# Patient Record
Sex: Female | Born: 1960 | Race: Black or African American | Hispanic: No | Marital: Married | Smoking: Never smoker
Health system: Southern US, Community
[De-identification: ages and names within clinical notes are randomized; demographics above are authoritative.]

## PROBLEM LIST (undated history)

## (undated) DIAGNOSIS — I10 Essential (primary) hypertension: Secondary | ICD-10-CM

## (undated) DIAGNOSIS — E785 Hyperlipidemia, unspecified: Secondary | ICD-10-CM

## (undated) DIAGNOSIS — R9431 Abnormal electrocardiogram [ECG] [EKG]: Secondary | ICD-10-CM

## (undated) HISTORY — DX: Essential (primary) hypertension: I10

## (undated) HISTORY — PX: ABDOMINAL HYSTERECTOMY: SHX81

## (undated) HISTORY — DX: Hyperlipidemia, unspecified: E78.5

## (undated) HISTORY — DX: Abnormal electrocardiogram (ECG) (EKG): R94.31

---

## 2013-09-02 ENCOUNTER — Other Ambulatory Visit: Payer: Self-pay | Admitting: Family Medicine

## 2013-09-02 DIAGNOSIS — Z1231 Encounter for screening mammogram for malignant neoplasm of breast: Secondary | ICD-10-CM

## 2013-09-24 ENCOUNTER — Ambulatory Visit
Admission: RE | Admit: 2013-09-24 | Discharge: 2013-09-24 | Disposition: A | Discharge status: Home / Self Care | Payer: BC Managed Care – PPO | Source: Ambulatory Visit | Attending: Family Medicine | Admitting: Family Medicine

## 2013-09-24 DIAGNOSIS — Z1231 Encounter for screening mammogram for malignant neoplasm of breast: Secondary | ICD-10-CM

## 2013-10-03 ENCOUNTER — Ambulatory Visit
Admission: RE | Admit: 2013-10-03 | Discharge: 2013-10-03 | Disposition: A | Discharge status: Home / Self Care | Payer: BC Managed Care – PPO | Source: Ambulatory Visit | Attending: Family Medicine | Admitting: Family Medicine

## 2013-10-03 ENCOUNTER — Other Ambulatory Visit: Payer: Self-pay | Admitting: Family Medicine

## 2013-10-03 DIAGNOSIS — R911 Solitary pulmonary nodule: Secondary | ICD-10-CM

## 2013-12-04 ENCOUNTER — Ambulatory Visit (INDEPENDENT_AMBULATORY_CARE_PROVIDER_SITE_OTHER): Payer: Self-pay | Admitting: Family Medicine

## 2013-12-04 VITALS — BP 130/80 | HR 60 | Temp 98.9°F | Resp 16 | Ht 65.0 in | Wt 162.6 lb

## 2013-12-04 DIAGNOSIS — H53131 Sudden visual loss, right eye: Secondary | ICD-10-CM

## 2013-12-04 DIAGNOSIS — H53139 Sudden visual loss, unspecified eye: Secondary | ICD-10-CM

## 2013-12-04 NOTE — Progress Notes (Signed)
Subjective: 53 year old lady from the [place] who is a Sport and exercise psychologist in education at [employer]. she awakened early yesterday morning and had a significant visual loss of her right eye. She proceeded as being a 90% vision deficit in the right eye. She can see fine from left. She was able to get himself up and go to the bathroom and back to bed. After several minutes the loss of vision subsided. She did go to see her eye doctor yesterday, who did a dilated pupil exam and did not find anything specific. He said a message to her doctors at [facility]. Apparently he said it could have been a little blood vessel that popped. Her vision has remained satisfactory. She has a relatively new set of glasses. She did talk with her physicians at the [facility] clinic who wanted her to come get checked. She did not have any other neurologic symptoms with speech or coronation.  She has a history of hypertension hyperlipidemia, on medications. No other major health issues.  Objective: Pleasant alert lady in no acute distress. Her eyes PERRLA. EOMs intact. Fundi benign with discs appear normal. No retinal abnormalities were noted. Adequate visualization was obtained. No carotid bruits. Chest clear. Heart regular without murmurs. Vital signs are stable.  Assessment: Transient right eye vision loss, resolved  Plan: In the absence of any other symptoms I do not believe this was any major event. No indication was found for doing additional workup at this time. If she has any further problems she is to return or go to the emergency room.  A copy of the note will be sent with her to her physicians at [place].

## 2013-12-04 NOTE — Patient Instructions (Signed)
Return if any further symptoms or recurrences. If you are unable to come in here at the time or we are closed, go to the emergency room.

## 2014-08-26 ENCOUNTER — Other Ambulatory Visit: Payer: Self-pay | Admitting: Family Medicine

## 2014-08-26 DIAGNOSIS — R911 Solitary pulmonary nodule: Secondary | ICD-10-CM

## 2014-08-28 ENCOUNTER — Other Ambulatory Visit: Payer: Self-pay

## 2014-10-13 DIAGNOSIS — R3129 Other microscopic hematuria: Secondary | ICD-10-CM | POA: Insufficient documentation

## 2014-10-13 HISTORY — DX: Other microscopic hematuria: R31.29

## 2014-10-16 DIAGNOSIS — R82998 Other abnormal findings in urine: Secondary | ICD-10-CM

## 2014-10-16 HISTORY — DX: Other abnormal findings in urine: R82.998

## 2014-11-02 ENCOUNTER — Other Ambulatory Visit: Payer: Self-pay

## 2014-11-02 DIAGNOSIS — Z1231 Encounter for screening mammogram for malignant neoplasm of breast: Secondary | ICD-10-CM

## 2014-11-06 ENCOUNTER — Ambulatory Visit
Admission: RE | Admit: 2014-11-06 | Discharge: 2014-11-06 | Disposition: A | Discharge status: Home / Self Care | Payer: BLUE CROSS/BLUE SHIELD | Source: Ambulatory Visit

## 2014-11-06 DIAGNOSIS — Z1231 Encounter for screening mammogram for malignant neoplasm of breast: Secondary | ICD-10-CM

## 2015-01-30 ENCOUNTER — Ambulatory Visit (INDEPENDENT_AMBULATORY_CARE_PROVIDER_SITE_OTHER): Payer: Self-pay | Admitting: Family Medicine

## 2015-01-30 VITALS — BP 124/80 | HR 79 | Temp 98.2°F | Ht 65.0 in | Wt 161.6 lb

## 2015-01-30 DIAGNOSIS — T148 Other injury of unspecified body region: Secondary | ICD-10-CM

## 2015-01-30 DIAGNOSIS — T148XXA Other injury of unspecified body region, initial encounter: Secondary | ICD-10-CM

## 2015-01-30 DIAGNOSIS — M25559 Pain in unspecified hip: Secondary | ICD-10-CM

## 2015-01-30 NOTE — Progress Notes (Signed)
      Chief Complaint:  Chief Complaint  Patient presents with  . Pelvic Pain    x's 3 weeks s/p exercise    HPI: Carolyn Morgan is a 54 y.o. female who reports to [facility] today complaining of pelvic pain along her Cesarean section scar about 3 weeks ago after she was doing some new leg lift exercise. She has been getting better, about 50 % better without taking anything. She denies any n/w/t, hematuria, n/v. She denies any urinary sxs or vaginal dc. No incontinence  No past medical history on file. No past surgical history on file. Social History   Social History  . Marital Status: Married    Spouse Name: N/A  . Number of Children: N/A  . Years of Education: N/A   Social History Main Topics  . Smoking status: Never Smoker   . Smokeless tobacco: None  . Alcohol Use: No  . Drug Use: No  . Sexual Activity: Not Asked   Other Topics Concern  . None   Social History Narrative   No family history on file. No Known Allergies Prior to Admission medications   Medication Sig Start Date End Date Taking? Authorizing Provider  atorvastatin (LIPITOR) 10 MG tablet Take 10 mg by mouth daily.   Yes Historical Provider, MD  lisinopril-hydrochlorothiazide (PRINZIDE,ZESTORETIC) 20-12.5 MG per tablet Take 1 tablet by mouth daily.   Yes Historical Provider, MD     ROS: The patient denies fevers, chills, night sweats, unintentional weight loss, chest pain, palpitations, wheezing, dyspnea on exertion, nausea, vomiting, abdominal pain, dysuria, hematuria, melena, numbness, weakness, or tingling.   All other systems have been reviewed and were otherwise negative with the exception of those mentioned in the HPI and as above.    PHYSICAL EXAM: Filed Vitals:   01/30/15 1007  BP: 124/80  Pulse: 79  Temp: 98.2 F (36.8 C)   Body mass index is 26.89 kg/(m^2).   General: Alert, no acute distress HEENT:  Normocephalic, atraumatic, oropharynx patent. EOMI, PERRLA Cardiovascular:  Regular rate  and rhythm, no rubs murmurs or gallops.  Respiratory: Clear to auscultation bilaterally.  No wheezes, rales, or rhonchi.  No cyanosis, no use of accessory musculature Abdominal: No organomegaly, abdomen is soft and non-tender, positive bowel sounds. No masses. Skin: No rashes. Neurologic: Facial musculature symmetric. Psychiatric: Patient acts appropriately throughout our interaction. Lymphatic: No cervical or submandibular lymphadenopathy Musculoskeletal: Gait intact. No edema, tenderness Full ROm, nontender hips Minimal tenderness deep palpation of c section scar. 5/5 sterngth Lumbar normal No saddle anesthesia  LABS: No results found for this or any previous visit.   EKG/XRAY:   Primary read interpreted by Dr. Marin Comment at [facility].   ASSESSMENT/PLAN: Encounter Diagnoses  Name Primary?  . Sprain and strain Yes  . Pain in joint, pelvic region and thigh, unspecified laterality    Most likely related to exercises, may be just msk strain or sprain or possible irritation of scar tissue . No e/o hernia.No dysuira. Fu prn , warm compresses, tylenol, NSAIDs prn  If worsening sxs then consider MRI, she was offered the option of xray but declined which I think is fine since improving and no real traumatic injury Fu prn   Gross sideeffects, risk and benefits, and alternatives of medications d/w patient. Patient is aware that all medications have potential sideeffects and we are unable to predict every sideeffect or drug-drug interaction that may occur.  Tiernan Millikin DO  01/30/2015 10:53 AM

## 2015-11-05 ENCOUNTER — Ambulatory Visit (INDEPENDENT_AMBULATORY_CARE_PROVIDER_SITE_OTHER): Payer: BC Managed Care – PPO | Admitting: Physician Assistant

## 2015-11-05 VITALS — BP 140/88 | HR 67 | Temp 98.4°F | Resp 16 | Ht 65.0 in | Wt 168.0 lb

## 2015-11-05 DIAGNOSIS — I10 Essential (primary) hypertension: Secondary | ICD-10-CM | POA: Diagnosis not present

## 2015-11-05 DIAGNOSIS — Z1159 Encounter for screening for other viral diseases: Secondary | ICD-10-CM

## 2015-11-05 DIAGNOSIS — Z860100 Personal history of colon polyps, unspecified: Secondary | ICD-10-CM

## 2015-11-05 DIAGNOSIS — E785 Hyperlipidemia, unspecified: Secondary | ICD-10-CM | POA: Diagnosis not present

## 2015-11-05 DIAGNOSIS — Z114 Encounter for screening for human immunodeficiency virus [HIV]: Secondary | ICD-10-CM | POA: Diagnosis not present

## 2015-11-05 HISTORY — DX: Personal history of colon polyps, unspecified: Z86.0100

## 2015-11-05 HISTORY — DX: Essential (primary) hypertension: I10

## 2015-11-05 LAB — CBC WITH DIFFERENTIAL/PLATELET
BASOS ABS: 57 {cells}/uL (ref 0–200)
Basophils Relative: 1 %
EOS PCT: 2 %
Eosinophils Absolute: 114 cells/uL (ref 15–500)
HEMATOCRIT: 39.9 % (ref 35.0–45.0)
HEMOGLOBIN: 13.7 g/dL (ref 11.7–15.5)
LYMPHS ABS: 2793 {cells}/uL (ref 850–3900)
Lymphocytes Relative: 49 %
MCH: 29.4 pg (ref 27.0–33.0)
MCHC: 34.3 g/dL (ref 32.0–36.0)
MCV: 85.6 fL (ref 80.0–100.0)
MONO ABS: 399 {cells}/uL (ref 200–950)
MPV: 10.3 fL (ref 7.5–12.5)
Monocytes Relative: 7 %
NEUTROS ABS: 2337 {cells}/uL (ref 1500–7800)
Neutrophils Relative %: 41 %
Platelets: 271 10*3/uL (ref 140–400)
RBC: 4.66 MIL/uL (ref 3.80–5.10)
RDW: 12.8 % (ref 11.0–15.0)
WBC: 5.7 10*3/uL (ref 3.8–10.8)

## 2015-11-05 LAB — COMPREHENSIVE METABOLIC PANEL
ALBUMIN: 4.3 g/dL (ref 3.6–5.1)
ALK PHOS: 82 U/L (ref 33–130)
ALT: 28 U/L (ref 6–29)
AST: 24 U/L (ref 10–35)
BUN: 14 mg/dL (ref 7–25)
CALCIUM: 9.3 mg/dL (ref 8.6–10.4)
CO2: 25 mmol/L (ref 20–31)
CREATININE: 0.66 mg/dL (ref 0.50–1.05)
Chloride: 103 mmol/L (ref 98–110)
Glucose, Bld: 92 mg/dL (ref 65–99)
Potassium: 3.7 mmol/L (ref 3.5–5.3)
SODIUM: 140 mmol/L (ref 135–146)
TOTAL PROTEIN: 7.4 g/dL (ref 6.1–8.1)
Total Bilirubin: 0.7 mg/dL (ref 0.2–1.2)

## 2015-11-05 LAB — LIPID PANEL
Cholesterol: 170 mg/dL (ref 125–200)
HDL: 49 mg/dL (ref >=46)
LDL Cholesterol: 69 mg/dL (ref <130)
Total CHOL/HDL Ratio: 3.5 Ratio (ref <=5.0)
Triglycerides: 259 mg/dL — ABNORMAL HIGH (ref <150)
VLDL: 52 mg/dL — ABNORMAL HIGH (ref <30)

## 2015-11-05 LAB — TSH: TSH: 1.18 mIU/L

## 2015-11-05 MED ORDER — ATORVASTATIN CALCIUM 10 MG PO TABS: 10.0000 mg | ORAL_TABLET | Freq: Every day | ORAL | Status: DC

## 2015-11-05 MED ORDER — LISINOPRIL-HYDROCHLOROTHIAZIDE 20-12.5 MG PO TABS: 1.0000 | ORAL_TABLET | Freq: Every day | ORAL | Status: DC

## 2015-11-05 NOTE — Progress Notes (Signed)
Patient ID: Carolyn Morgan, female    DOB: Oct 13, 1960, 55 y.o.   MRN: [id number]  PCP: No PCP Per Patient  Subjective:   Chief Complaint  Patient presents with  . Medication Refill    lisinopril and lipitor/ and blood work to check cholestrol  . establish care    pt would like a primary care provider    HPI Presents for medication refills and to establish for primary care. She has been using this practice, but has seen a different provider each time. She is interested in continuity with a single provider here, or recommendations for an alternative.  Otherwise, she feels well.    Review of Systems  Constitutional: Negative for activity change, appetite change, fatigue and unexpected weight change.  HENT: Negative for congestion, dental problem, ear pain, hearing loss, mouth sores, postnasal drip, rhinorrhea, sneezing, sore throat, tinnitus and trouble swallowing.   Eyes: Negative for photophobia, pain, redness and visual disturbance.  Respiratory: Negative for cough, chest tightness and shortness of breath.   Cardiovascular: Negative for chest pain, palpitations and leg swelling.  Gastrointestinal: Negative for nausea, vomiting, abdominal pain, diarrhea, constipation and blood in stool.  Genitourinary: Negative for dysuria, urgency, frequency and hematuria.  Musculoskeletal: Negative for myalgias, arthralgias, gait problem and neck stiffness.  Skin: Negative for rash.  Neurological: Negative for dizziness, speech difficulty, weakness, light-headedness, numbness and headaches.  Hematological: Negative for adenopathy.  Psychiatric/Behavioral: Negative for confusion and sleep disturbance. The patient is not nervous/anxious.        Patient Active Problem List   Diagnosis Date Noted  . Benign essential HTN 11/05/2015  . Hyperlipidemia 11/05/2015  . History of colonic polyps 11/05/2015  . 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 Microscopic hematuria 10/13/2014     Prior to  Admission medications   Medication Sig Start Date End Date Taking? Authorizing Provider  atorvastatin (LIPITOR) 10 MG tablet Take 10 mg by mouth daily.   Yes Historical Provider, MD  lisinopril-hydrochlorothiazide (PRINZIDE,ZESTORETIC) 20-12.5 MG per tablet Take 1 tablet by mouth daily.   Yes Historical Provider, MD     No Known Allergies     Objective:  Physical Exam  Constitutional: She is oriented to person, place, and time. She appears well-developed and well-nourished. She is active and cooperative. No distress.  BP 140/88 mmHg  Pulse 67  Temp(Src) 98.4 F (36.9 C) (Oral)  Resp 16  Ht 5\' 5"  (1.651 m)  Wt 168 lb (76.204 kg)  BMI 27.96 kg/m2  SpO2 99%  HENT:  Head: Normocephalic and atraumatic.  Right Ear: Hearing normal.  Left Ear: Hearing normal.  Eyes: Conjunctivae are normal. No scleral icterus.  Neck: Normal range of motion. Neck supple. No thyromegaly present.  Cardiovascular: Normal rate, regular rhythm and normal heart sounds.   Pulses:      Radial pulses are 2+ on the right side, and 2+ on the left side.  Pulmonary/Chest: Effort normal and breath sounds normal.  Lymphadenopathy:       Head (right side): No tonsillar, no preauricular, no posterior auricular and no occipital adenopathy present.       Head (left side): No tonsillar, no preauricular, no posterior auricular and no occipital adenopathy present.    She has no cervical adenopathy.       Right: No supraclavicular adenopathy present.       Left: No supraclavicular adenopathy present.  Neurological: She is alert and oriented to person, place, and time. No sensory deficit.  Skin: Skin is warm, dry  and intact. No rash noted. No cyanosis or erythema. Nails show no clubbing.  Psychiatric: She has a normal mood and affect. Her speech is normal and behavior is normal.           Assessment & Plan:   1. Benign essential HTN Stable. Continue current treatment. - CBC with Differential/Platelet -  Comprehensive metabolic panel - TSH - lisinopril-hydrochlorothiazide (PRINZIDE,ZESTORETIC) 20-12.5 MG tablet; Take 1 tablet by mouth daily.  Dispense: 90 tablet; Refill: 3  2. Hyperlipidemia Await lab results. Adjust statin dose if indicated. - Lipid panel - atorvastatin (LIPITOR) 10 MG tablet; Take 1 tablet (10 mg total) by mouth daily.  Dispense: 90 tablet; Refill: 3  3. Need for hepatitis C screening test 4. Screening for HIV (human immunodeficiency virus) She declines these today and prefers to wait until her next visit.   Return in about 6 months (around 05/07/2016).   Fara Chute, PA-C Physician Assistant-Certified [facility]

## 2015-11-05 NOTE — Patient Instructions (Addendum)
Please locate your previous records.  Specifically, your vaccination records, and any documentation of Hepatitis C and HIV results.    IF you received an x-ray today, you will receive an invoice from [facility]. Please contact [facility] at [phone] with questions or concerns regarding your invoice.   IF you received labwork today, you will receive an invoice from [employer]. Please contact [facility] at [phone] with questions or concerns regarding your invoice.   Our billing staff will not be able to assist you with questions regarding bills from these companies.  You will be contacted with the lab results as soon as they are available. The fastest way to get your results is to activate your My Chart account. Instructions are located on the last page of this paperwork. If you have not heard from [place] regarding the results in 2 weeks, please contact this office.

## 2015-11-07 ENCOUNTER — Encounter: Payer: Self-pay | Admitting: Physician Assistant

## 2016-03-21 ENCOUNTER — Other Ambulatory Visit (HOSPITAL_COMMUNITY)
Admission: RE | Admit: 2016-03-21 | Discharge: 2016-03-21 | Disposition: A | Discharge status: Home / Self Care | Payer: BLUE CROSS/BLUE SHIELD | Source: Ambulatory Visit | Attending: Obstetrics and Gynecology | Admitting: Obstetrics and Gynecology

## 2016-03-21 ENCOUNTER — Ambulatory Visit
Admission: RE | Admit: 2016-03-21 | Discharge: 2016-03-21 | Disposition: A | Discharge status: Home / Self Care | Payer: BLUE CROSS/BLUE SHIELD | Source: Ambulatory Visit | Attending: Obstetrics and Gynecology | Admitting: Obstetrics and Gynecology

## 2016-03-21 ENCOUNTER — Other Ambulatory Visit: Payer: Self-pay | Admitting: Obstetrics and Gynecology

## 2016-03-21 ENCOUNTER — Ambulatory Visit: Payer: BLUE CROSS/BLUE SHIELD

## 2016-03-21 DIAGNOSIS — Z1151 Encounter for screening for human papillomavirus (HPV): Secondary | ICD-10-CM | POA: Insufficient documentation

## 2016-03-21 DIAGNOSIS — Z01419 Encounter for gynecological examination (general) (routine) without abnormal findings: Secondary | ICD-10-CM | POA: Diagnosis present

## 2016-03-21 DIAGNOSIS — Z1231 Encounter for screening mammogram for malignant neoplasm of breast: Secondary | ICD-10-CM

## 2016-03-30 LAB — CYTOLOGY - PAP
DIAGNOSIS: NEGATIVE
HPV (WINDOPATH): NOT DETECTED

## 2016-05-09 ENCOUNTER — Ambulatory Visit: Payer: BC Managed Care – PPO | Admitting: Physician Assistant

## 2016-06-08 ENCOUNTER — Other Ambulatory Visit: Payer: Self-pay | Admitting: Internal Medicine

## 2016-06-08 DIAGNOSIS — R911 Solitary pulmonary nodule: Secondary | ICD-10-CM

## 2016-06-09 ENCOUNTER — Ambulatory Visit
Admission: RE | Admit: 2016-06-09 | Discharge: 2016-06-09 | Disposition: A | Discharge status: Home / Self Care | Payer: BLUE CROSS/BLUE SHIELD | Source: Ambulatory Visit | Attending: Internal Medicine | Admitting: Internal Medicine

## 2016-06-09 DIAGNOSIS — R911 Solitary pulmonary nodule: Secondary | ICD-10-CM

## 2016-09-27 ENCOUNTER — Ambulatory Visit: Payer: BC Managed Care – PPO | Admitting: Podiatry

## 2016-10-04 ENCOUNTER — Ambulatory Visit: Payer: BC Managed Care – PPO | Admitting: Podiatry

## 2016-11-12 ENCOUNTER — Other Ambulatory Visit: Payer: Self-pay | Admitting: Physician Assistant

## 2016-11-12 DIAGNOSIS — E785 Hyperlipidemia, unspecified: Secondary | ICD-10-CM

## 2016-11-13 ENCOUNTER — Other Ambulatory Visit: Payer: Self-pay | Admitting: Physician Assistant

## 2016-11-13 DIAGNOSIS — I10 Essential (primary) hypertension: Secondary | ICD-10-CM

## 2017-02-17 ENCOUNTER — Other Ambulatory Visit: Payer: Self-pay | Admitting: Physician Assistant

## 2017-02-17 DIAGNOSIS — I10 Essential (primary) hypertension: Secondary | ICD-10-CM

## 2017-03-29 ENCOUNTER — Other Ambulatory Visit: Payer: Self-pay | Admitting: Internal Medicine

## 2017-03-29 DIAGNOSIS — Z1231 Encounter for screening mammogram for malignant neoplasm of breast: Secondary | ICD-10-CM

## 2017-04-16 ENCOUNTER — Ambulatory Visit
Admission: RE | Admit: 2017-04-16 | Discharge: 2017-04-16 | Disposition: A | Discharge status: Home / Self Care | Payer: BC Managed Care – PPO | Source: Ambulatory Visit | Attending: Internal Medicine | Admitting: Internal Medicine

## 2017-04-16 DIAGNOSIS — Z1231 Encounter for screening mammogram for malignant neoplasm of breast: Secondary | ICD-10-CM

## 2017-06-15 ENCOUNTER — Ambulatory Visit (INDEPENDENT_AMBULATORY_CARE_PROVIDER_SITE_OTHER): Payer: BC Managed Care – PPO

## 2017-06-15 ENCOUNTER — Ambulatory Visit: Payer: BC Managed Care – PPO | Admitting: Podiatry

## 2017-06-15 ENCOUNTER — Encounter: Payer: Self-pay | Admitting: Podiatry

## 2017-06-15 DIAGNOSIS — M779 Enthesopathy, unspecified: Secondary | ICD-10-CM

## 2017-06-15 DIAGNOSIS — M79673 Pain in unspecified foot: Secondary | ICD-10-CM | POA: Diagnosis not present

## 2017-06-15 DIAGNOSIS — L84 Corns and callosities: Secondary | ICD-10-CM

## 2017-06-15 DIAGNOSIS — M2040 Other hammer toe(s) (acquired), unspecified foot: Secondary | ICD-10-CM

## 2017-06-15 DIAGNOSIS — M7752 Other enthesopathy of left foot: Secondary | ICD-10-CM

## 2017-06-15 DIAGNOSIS — M2042 Other hammer toe(s) (acquired), left foot: Secondary | ICD-10-CM | POA: Diagnosis not present

## 2017-06-15 MED ORDER — TRIAMCINOLONE ACETONIDE 10 MG/ML IJ SUSP
10.0000 mg | Freq: Once | INTRAMUSCULAR | Status: AC
  Administered 2017-06-15: 10 mg

## 2017-06-15 NOTE — Progress Notes (Signed)
Subjective:   Patient ID: Carolyn Morgan, female   DOB: 57 y.o.   MRN: [id number]   HPI Patient presents with quite a bit of pain between the fourth and fifth toes on the left foot with rotated toe and keratotic lesion with fluid around the interphalangeal joint of the toe and states that it has been this way for about 6 months and gradually becoming more tender   Review of Systems  All other systems reviewed and are negative.       Objective:  Physical Exam  Constitutional: She appears well-developed and well-nourished.  Cardiovascular: Intact distal pulses.  Pulmonary/Chest: Effort normal.  Musculoskeletal: Normal range of motion.  Neurological: She is alert.  Skin: Skin is warm.  Nursing note and vitals reviewed.   Neurovascular status intact with muscle strength adequate range of motion within normal limits with patient noted to have acute inflammation of the fourth digit left inner phalangeal joint with rotation of the fifth toe pressing against the fourth toe.  Patient does not smoke and was found to be quite active but would like to be more active but is having trouble wearing shoe gear     Assessment:  Inflammatory capsulitis of the inner phalangeal joint digit 4 left with a rotated fifth toe with keratotic lesion     Plan:  H&P condition reviewed and proximal nerve block administered fourth toe and I carefully injected around the interphalangeal joint 2 mg dexamethasone Kenalog and then did deep debridement of lesion fourth and fifth toe and applied padding to take pressure off the digits.  Reappoint to recheck  X-ray indicates rotation of the fifth toe left against the fourth toe sign visit

## 2017-09-17 ENCOUNTER — Encounter: Payer: Self-pay | Admitting: Physician Assistant

## 2017-12-10 ENCOUNTER — Emergency Department (HOSPITAL_COMMUNITY): Payer: BC Managed Care – PPO

## 2017-12-10 ENCOUNTER — Emergency Department (HOSPITAL_COMMUNITY)
Admission: EM | Admit: 2017-12-10 | Discharge: 2017-12-10 | Disposition: A | Discharge status: Home / Self Care | Payer: BC Managed Care – PPO | Attending: Emergency Medicine | Admitting: Emergency Medicine

## 2017-12-10 ENCOUNTER — Other Ambulatory Visit: Payer: Self-pay

## 2017-12-10 ENCOUNTER — Encounter (HOSPITAL_COMMUNITY): Payer: Self-pay | Admitting: Emergency Medicine

## 2017-12-10 DIAGNOSIS — S8264XA Nondisplaced fracture of lateral malleolus of right fibula, initial encounter for closed fracture: Secondary | ICD-10-CM | POA: Insufficient documentation

## 2017-12-10 DIAGNOSIS — W010XXA Fall on same level from slipping, tripping and stumbling without subsequent striking against object, initial encounter: Secondary | ICD-10-CM | POA: Diagnosis not present

## 2017-12-10 DIAGNOSIS — Y929 Unspecified place or not applicable: Secondary | ICD-10-CM | POA: Diagnosis not present

## 2017-12-10 DIAGNOSIS — Y939 Activity, unspecified: Secondary | ICD-10-CM | POA: Diagnosis not present

## 2017-12-10 DIAGNOSIS — Z79899 Other long term (current) drug therapy: Secondary | ICD-10-CM | POA: Diagnosis not present

## 2017-12-10 DIAGNOSIS — S82891A Other fracture of right lower leg, initial encounter for closed fracture: Secondary | ICD-10-CM

## 2017-12-10 DIAGNOSIS — S99911A Unspecified injury of right ankle, initial encounter: Secondary | ICD-10-CM | POA: Diagnosis present

## 2017-12-10 DIAGNOSIS — Y999 Unspecified external cause status: Secondary | ICD-10-CM | POA: Diagnosis not present

## 2017-12-10 DIAGNOSIS — I10 Essential (primary) hypertension: Secondary | ICD-10-CM | POA: Diagnosis not present

## 2017-12-10 MED ORDER — HYDROCODONE-ACETAMINOPHEN 5-325 MG PO TABS: 1.0000 | ORAL_TABLET | ORAL | 0 refills | Status: DC | PRN

## 2017-12-10 NOTE — ED Provider Notes (Signed)
[facility] DEPT Provider Note   CSN: [id number] Arrival date & time: 12/10/17  8315     History   Chief Complaint Chief Complaint  Patient presents with  . Ankle Injury    HPI Carolyn Morgan is a 57 y.o. female.  57 year old female presents after mechanical injury to her right ankle just prior to arrival.  Slipped and twisted it.  Denies any right knee or right hip pain.  Cannot bear weight when she walks.  Pain is localized to her lateral ankle on the right.  It is characterizes dull and better with rest.  No treatment used prior to arrival.     Past Medical History:  Diagnosis Date  . Hyperlipidemia   . Hypertension     Patient Active Problem List   Diagnosis Date Noted  . Benign essential HTN 11/05/2015  . Hyperlipidemia 11/05/2015  . History of colonic polyps 11/05/2015  . 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 Microscopic hematuria 10/13/2014    Past Surgical History:  Procedure Laterality Date  . ABDOMINAL HYSTERECTOMY    . CESAREAN SECTION       OB History   None      Home Medications    Prior to Admission medications   Medication Sig Start Date End Date Taking? Authorizing Provider  atorvastatin (LIPITOR) 10 MG tablet Take 1 tablet (10 mg total) by mouth daily. 11/05/15   Harrison Mons, PA-C  lisinopril-hydrochlorothiazide (PRINZIDE,ZESTORETIC) 20-12.5 MG tablet TAKE 1 TABLET BY MOUTH EVERY DAY 11/13/16   Harrison Mons, PA-C    Family History Family History  Problem Relation Age of Onset  . Diabetes Mother   . Diabetes Sister   . Hypertension Sister     Social History Social History   Tobacco Use  . Smoking status: Never Smoker  . Smokeless tobacco: Never Used  Substance Use Topics  . Alcohol use: No    Alcohol/week: 0.0 oz  . Drug use: No     Allergies   Patient has no known allergies.   Review of Systems Review of Systems  All other systems reviewed and are negative.    Physical  Exam Updated Vital Signs BP (!) 160/89 (BP Location: Left Arm)   Pulse 64   Temp 98 F (36.7 C) (Oral)   Resp 17   Ht 1.651 m (5\' 5" )   Wt 77.1 kg (170 lb)   SpO2 99%   BMI 28.29 kg/m   Physical Exam  Constitutional: She is oriented to person, place, and time. She appears well-developed and well-nourished.  Non-toxic appearance. No distress.  HENT:  Head: Normocephalic and atraumatic.  Eyes: Pupils are equal, round, and reactive to light. Conjunctivae, EOM and lids are normal.  Neck: Normal range of motion. Neck supple. No tracheal deviation present. No thyroid mass present.  Cardiovascular: Normal rate, regular rhythm and normal heart sounds. Exam reveals no gallop.  No murmur heard. Pulmonary/Chest: Effort normal and breath sounds normal. No stridor. No respiratory distress. She has no decreased breath sounds. She has no wheezes. She has no rhonchi. She has no rales.  Abdominal: Soft. Normal appearance and bowel sounds are normal. She exhibits no distension. There is no tenderness. There is no rebound and no CVA tenderness.  Musculoskeletal: She exhibits no edema.       Right ankle: She exhibits decreased range of motion and swelling. She exhibits no ecchymosis and no laceration. Tenderness. Lateral malleolus tenderness found.       Feet:  Neurological: She  is alert and oriented to person, place, and time. She has normal strength. No cranial nerve deficit or sensory deficit. GCS eye subscore is 4. GCS verbal subscore is 5. GCS motor subscore is 6.  Skin: Skin is warm and dry. No abrasion and no rash noted.  Psychiatric: She has a normal mood and affect. Her speech is normal and behavior is normal.  Nursing note and vitals reviewed.    ED Treatments / Results  Labs (all labs ordered are listed, but only abnormal results are displayed) Labs Reviewed - No data to display  EKG None  Radiology No results found.  Procedures Procedures (including critical care  time)  Medications Ordered in ED Medications - No data to display   Initial Impression / Assessment and Plan / ED Course  I have reviewed the triage vital signs and the nursing notes.  Pertinent labs & imaging results that were available during my care of the patient were reviewed by me and considered in my medical decision making (see chart for details).     X-rays consistent with distal fibula fracture.  Splint applied by orthopedic tech.  Referral to Ortho on-call given  Final Clinical Impressions(s) / ED Diagnoses   Final diagnoses:  None    ED Discharge Orders    None       Lacretia Leigh, MD 12/10/17 [phone]

## 2017-12-10 NOTE — ED Triage Notes (Signed)
Pt reports that she was watering her flowers this morning and slipped and fell landing on right ankle. Pt reporting pain to lateral ankle.

## 2017-12-18 ENCOUNTER — Ambulatory Visit (INDEPENDENT_AMBULATORY_CARE_PROVIDER_SITE_OTHER): Payer: BC Managed Care – PPO | Admitting: Orthopaedic Surgery

## 2017-12-18 ENCOUNTER — Ambulatory Visit (INDEPENDENT_AMBULATORY_CARE_PROVIDER_SITE_OTHER): Payer: Self-pay

## 2017-12-18 DIAGNOSIS — S8264XA Nondisplaced fracture of lateral malleolus of right fibula, initial encounter for closed fracture: Secondary | ICD-10-CM | POA: Diagnosis not present

## 2017-12-18 NOTE — Progress Notes (Signed)
Office Visit Note   Patient: Carolyn Morgan           Date of Birth: 1960/07/08           MRN: [id number] Visit Date: 12/18/2017              Requested by: Harrison Mons, PA-C No address on file PCP: Harrison Mons, PA-C   Assessment & Plan: Visit Diagnoses:  1. Nondisplaced fracture of lateral malleolus of right fibula, initial encounter for closed fracture     Plan: Impression is nondisplaced right lateral malleolus fracture.  This should be amenable to nonoperative treatment.  Cam walker nonweightbearing with crutches.  Elevation and ice around-the-clock.  Work note provided.  Follow-up in 2 weeks with three-view x-rays of the right ankle.  Follow-Up Instructions: Return in about 2 weeks (around 01/01/2018).   Orders:  Orders Placed This Encounter  Procedures  . XR Ankle Complete Right   No orders of the defined types were placed in this encounter.     Procedures: No procedures performed   Clinical Data: No additional findings.   Subjective: Chief Complaint  Patient presents with  . Right Ankle - Pain    DOI 12/10/17    Patient is a very pleasant 57 year old female who had a mechanical fall on 12/10/2017 in her backyard.  She sustained a nondisplaced lateral malleolus fracture.  She was evaluated in the ED.  X-rays were obtained which confirmed this.  She was placed in a short leg splint and given follow-up today.  She denies any significant pain.  Does endorse swelling.   Review of Systems  Constitutional: Negative.   HENT: Negative.   Eyes: Negative.   Respiratory: Negative.   Cardiovascular: Negative.   Endocrine: Negative.   Musculoskeletal: Negative.   Neurological: Negative.   Hematological: Negative.   Psychiatric/Behavioral: Negative.   All other systems reviewed and are negative.    Objective: Vital Signs: There were no vitals taken for this visit.  Physical Exam  Constitutional: She is oriented to person, place, and time. She appears  well-developed and well-nourished.  HENT:  Head: Normocephalic and atraumatic.  Eyes: EOM are normal.  Neck: Neck supple.  Pulmonary/Chest: Effort normal.  Abdominal: Soft.  Neurological: She is alert and oriented to person, place, and time.  Skin: Skin is warm. Capillary refill takes less than 2 seconds.  Psychiatric: She has a normal mood and affect. Her behavior is normal. Judgment and thought content normal.  Nursing note and vitals reviewed.   Ortho Exam Right ankle exam shows moderate swelling without any skin compromise.  No neurovascular compromise. Specialty Comments:  No specialty comments available.  Imaging: Xr Ankle Complete Right  Result Date: 12/18/2017 Stable nondisplaced lateral malleolus fracture    PMFS History: Patient Active Problem List   Diagnosis Date Noted  . Benign essential HTN 11/05/2015  . Hyperlipidemia 11/05/2015  . History of colonic polyps 11/05/2015  . 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 Microscopic hematuria 10/13/2014   Past Medical History:  Diagnosis Date  . Hyperlipidemia   . Hypertension     Family History  Problem Relation Age of Onset  . Diabetes Mother   . Diabetes Sister   . Hypertension Sister     Past Surgical History:  Procedure Laterality Date  . ABDOMINAL HYSTERECTOMY    . CESAREAN SECTION     Social History   Occupational History  . Occupation: research associate    Comment: [employer]  Tobacco Use  . Smoking status:  Never Smoker  . Smokeless tobacco: Never Used  Substance and Sexual Activity  . Alcohol use: No    Alcohol/week: 0.0 oz  . Drug use: No  . Sexual activity: Not on file

## 2018-01-01 ENCOUNTER — Ambulatory Visit (INDEPENDENT_AMBULATORY_CARE_PROVIDER_SITE_OTHER): Payer: BC Managed Care – PPO | Admitting: Orthopaedic Surgery

## 2018-01-01 ENCOUNTER — Encounter (INDEPENDENT_AMBULATORY_CARE_PROVIDER_SITE_OTHER): Payer: Self-pay | Admitting: Orthopaedic Surgery

## 2018-01-01 ENCOUNTER — Ambulatory Visit (INDEPENDENT_AMBULATORY_CARE_PROVIDER_SITE_OTHER): Payer: BC Managed Care – PPO

## 2018-01-01 DIAGNOSIS — S8264XA Nondisplaced fracture of lateral malleolus of right fibula, initial encounter for closed fracture: Secondary | ICD-10-CM

## 2018-01-01 NOTE — Progress Notes (Signed)
   Post-Op Visit Note   Patient: Carolyn Morgan           Date of Birth: [birth year]           MRN: [id number] Visit Date: 01/01/2018 PCP: Harrison Mons, PA-C   Assessment & Plan:  Chief Complaint:  Chief Complaint  Patient presents with  . Right Ankle - Pain, Follow-up   Visit Diagnoses:  1. Nondisplaced fracture of lateral malleolus of right fibula, initial encounter for closed fracture     Plan: Patient is a pleasant 57 year old female who presents to our clinic today 3 weeks status post nondisplaced fracture lateral malleolus right ankle, date of injury 12/10/2017.  She has been compliant nonweightbearing in a cam walker.  Minimal pain.  She is having some swelling but is elevating for this.  Examination of the right ankle shows mild swelling.  Calf is soft and nontender.  She is neurovascular intact distally.  At this point, we will have her continue wearing her fracture boot for another 3 weeks.  She will remain nonweightbearing for 3 weeks.  Follow-up with [place] at that point with repeat x-rays.  Call with concerns or questions in the meantime.  Follow-Up Instructions: Return in about 3 weeks (around 01/22/2018).   Orders:  Orders Placed This Encounter  Procedures  . XR Ankle Complete Right   No orders of the defined types were placed in this encounter.   Imaging: Xr Ankle Complete Right  Result Date: 01/01/2018 Stable alignment of the fracture with good callus formation   PMFS History: Patient Active Problem List   Diagnosis Date Noted  . Benign essential HTN 11/05/2015  . Hyperlipidemia 11/05/2015  . History of colonic polyps 11/05/2015  . 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 Microscopic hematuria 10/13/2014   Past Medical History:  Diagnosis Date  . Hyperlipidemia   . Hypertension     Family History  Problem Relation Age of Onset  . Diabetes Mother   . Diabetes Sister   . Hypertension Sister     Past Surgical History:  Procedure Laterality Date  .  ABDOMINAL HYSTERECTOMY    . CESAREAN SECTION     Social History   Occupational History  . Occupation: research associate    Comment: [employer]  Tobacco Use  . Smoking status: Never Smoker  . Smokeless tobacco: Never Used  Substance and Sexual Activity  . Alcohol use: No    Alcohol/week: 0.0 oz  . Drug use: No  . Sexual activity: Not on file

## 2018-01-22 ENCOUNTER — Ambulatory Visit (INDEPENDENT_AMBULATORY_CARE_PROVIDER_SITE_OTHER): Payer: BC Managed Care – PPO | Admitting: Orthopaedic Surgery

## 2018-01-22 ENCOUNTER — Ambulatory Visit (INDEPENDENT_AMBULATORY_CARE_PROVIDER_SITE_OTHER): Payer: Self-pay

## 2018-01-22 ENCOUNTER — Encounter (INDEPENDENT_AMBULATORY_CARE_PROVIDER_SITE_OTHER): Payer: Self-pay | Admitting: Orthopaedic Surgery

## 2018-01-22 DIAGNOSIS — S8264XA Nondisplaced fracture of lateral malleolus of right fibula, initial encounter for closed fracture: Secondary | ICD-10-CM

## 2018-01-22 HISTORY — DX: Nondisplaced fracture of lateral malleolus of right fibula, initial encounter for closed fracture: S82.64XA

## 2018-01-22 NOTE — Progress Notes (Signed)
   Post-Op Visit Note   Patient: Carolyn Morgan           Date of Birth: May 29, 1961           MRN: [id number] Visit Date: 01/22/2018 PCP: Harrison Mons, PA-C   Assessment & Plan:  Chief Complaint:  Chief Complaint  Patient presents with  . Right Ankle - Pain   Visit Diagnoses:  1. Nondisplaced fracture of lateral malleolus of right fibula, initial encounter for closed fracture     Plan: Patient is a pleasant 57 year old female who presents our clinic today 6 weeks status post nondisplaced lateral malleolus fracture right ankle.  She has been nonweightbearing.  Examination of the right ankle reveals minimal swelling and moderate tenderness.  Calf is soft and nontender.  She is neurovascular intact distally.  At this point, we will allow the patient to start weightbearing as tolerated in her cam walker.  We will send her to formal physical therapy to work on range of motion exercises and ankle stabilization program.  She will follow-up with [place] in 6 weeks time for recheck.  Call if concerns or questions in the meantime.  Wean CAM to ASO brace.  Handicap placard given.  Will need 3 view xrays of right ankle on return.  Follow-Up Instructions: Return in about 6 weeks (around 03/05/2018).   Orders:  Orders Placed This Encounter  Procedures  . XR Ankle Complete Right   No orders of the defined types were placed in this encounter.   Imaging: Xr Ankle Complete Right  Result Date: 01/22/2018 Stable ankle fracture with good callus formation.   PMFS History: Patient Active Problem List   Diagnosis Date Noted  . Nondisplaced fracture of lateral malleolus of right fibula, initial encounter for closed fracture 01/22/2018  . Benign essential HTN 11/05/2015  . Hyperlipidemia 11/05/2015  . History of colonic polyps 11/05/2015  . 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 Microscopic hematuria 10/13/2014   Past Medical History:  Diagnosis Date  . Hyperlipidemia   . Hypertension     Family  History  Problem Relation Age of Onset  . Diabetes Mother   . Diabetes Sister   . Hypertension Sister     Past Surgical History:  Procedure Laterality Date  . ABDOMINAL HYSTERECTOMY    . CESAREAN SECTION     Social History   Occupational History  . Occupation: research associate    Comment: [employer]  Tobacco Use  . Smoking status: Never Smoker  . Smokeless tobacco: Never Used  Substance and Sexual Activity  . Alcohol use: No    Alcohol/week: 0.0 standard drinks  . Drug use: No  . Sexual activity: Not on file

## 2018-03-07 ENCOUNTER — Encounter (INDEPENDENT_AMBULATORY_CARE_PROVIDER_SITE_OTHER): Payer: Self-pay | Admitting: Orthopaedic Surgery

## 2018-03-07 ENCOUNTER — Ambulatory Visit (INDEPENDENT_AMBULATORY_CARE_PROVIDER_SITE_OTHER): Payer: BC Managed Care – PPO | Admitting: Orthopaedic Surgery

## 2018-03-07 ENCOUNTER — Ambulatory Visit (INDEPENDENT_AMBULATORY_CARE_PROVIDER_SITE_OTHER): Payer: BC Managed Care – PPO

## 2018-03-07 DIAGNOSIS — M25571 Pain in right ankle and joints of right foot: Secondary | ICD-10-CM

## 2018-03-07 DIAGNOSIS — S8264XA Nondisplaced fracture of lateral malleolus of right fibula, initial encounter for closed fracture: Secondary | ICD-10-CM

## 2018-03-07 NOTE — Progress Notes (Signed)
Office Visit Note   Patient: Carolyn Morgan           Date of Birth: May 18, 1961           MRN: [id number] Visit Date: 03/07/2018              Requested by: Harrison Mons, [address] [place], [place] [address] PCP: Lanice Shirts, MD   Assessment & Plan: Visit Diagnoses:  1. Nondisplaced fracture of lateral malleolus of right fibula, initial encounter for closed fracture   2. Pain in right ankle and joints of right foot     Plan: Impression is nearly healed lateral malleolus fracture right ankle.  At this point, we will have the patient transition from formal physical therapy to home exercise program to continue working on range of motion and strengthening exercises.  She will wear her ASO brace over the next few weeks when on uneven ground.  She will follow-up with [place] as needed.  Call with concerns or questions.  Follow-Up Instructions: Return if symptoms worsen or fail to improve.   Orders:  Orders Placed This Encounter  Procedures  . XR Ankle 2 Views Right   No orders of the defined types were placed in this encounter.     Procedures: No procedures performed   Clinical Data: No additional findings.   Subjective: Chief Complaint  Patient presents with  . Right Ankle - Pain    HPI patient is a pleasant 57 year old female presents to our clinic today almost 12 weeks out nondisplaced lateral malleolus fracture right ankle.  She has been doing well.  She has been in physical therapy working on range of motion.  Minimal pain to the right ankle.  She has been walking without a cam boot or ASO without any problems.  She does note occasional pain with the extremes of dorsiflexion plantarflexion.  Overall making great improvements.  Review of Systems as detailed in HPI.  All others reviewed and are negative.   Objective: Vital Signs: There were no vitals taken for this visit.  Physical Exam well-developed well-nourished female no acute distress.   Alert and oriented x3.  Ortho Exam examination of the right ankle reveals very minimal tenderness over the lateral malleolus.  Near full range of motion of the ankle.  Calf is soft and nontender.  She is neurovascular intact distally.  Specialty Comments:  No specialty comments available.  Imaging: Xr Ankle 2 Views Right  Result Date: 03/07/2018 X-rays demonstrate stable alignment of the fracture with good bony consolidation.    PMFS History: Patient Active Problem List   Diagnosis Date Noted  . Nondisplaced fracture of lateral malleolus of right fibula, initial encounter for closed fracture 01/22/2018  . Benign essential HTN 11/05/2015  . Hyperlipidemia 11/05/2015  . History of colonic polyps 11/05/2015  . 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 Microscopic hematuria 10/13/2014   Past Medical History:  Diagnosis Date  . Hyperlipidemia   . Hypertension     Family History  Problem Relation Age of Onset  . Diabetes Mother   . Diabetes Sister   . Hypertension Sister     Past Surgical History:  Procedure Laterality Date  . ABDOMINAL HYSTERECTOMY    . CESAREAN SECTION     Social History   Occupational History  . Occupation: research associate    Comment: [employer]  Tobacco Use  . Smoking status: Never Smoker  . Smokeless tobacco: Never Used  Substance and Sexual Activity  . Alcohol  use: No    Alcohol/week: 0.0 standard drinks  . Drug use: No  . Sexual activity: Not on file

## 2018-06-26 ENCOUNTER — Other Ambulatory Visit: Payer: Self-pay | Admitting: Internal Medicine

## 2018-06-26 DIAGNOSIS — Z1231 Encounter for screening mammogram for malignant neoplasm of breast: Secondary | ICD-10-CM

## 2018-07-23 ENCOUNTER — Ambulatory Visit
Admission: RE | Admit: 2018-07-23 | Discharge: 2018-07-23 | Disposition: A | Discharge status: Home / Self Care | Payer: BC Managed Care – PPO | Source: Ambulatory Visit | Attending: Internal Medicine | Admitting: Internal Medicine

## 2018-07-23 DIAGNOSIS — Z1231 Encounter for screening mammogram for malignant neoplasm of breast: Secondary | ICD-10-CM

## 2019-03-13 ENCOUNTER — Other Ambulatory Visit (HOSPITAL_COMMUNITY): Payer: Self-pay | Admitting: Gastroenterology

## 2019-03-13 ENCOUNTER — Other Ambulatory Visit: Payer: Self-pay | Admitting: Gastroenterology

## 2019-03-13 DIAGNOSIS — R1011 Right upper quadrant pain: Secondary | ICD-10-CM

## 2019-03-24 ENCOUNTER — Other Ambulatory Visit (HOSPITAL_COMMUNITY): Payer: Self-pay | Admitting: Gastroenterology

## 2019-03-24 ENCOUNTER — Other Ambulatory Visit: Payer: Self-pay

## 2019-03-24 ENCOUNTER — Encounter (HOSPITAL_COMMUNITY)
Admission: RE | Admit: 2019-03-24 | Discharge: 2019-03-24 | Disposition: A | Discharge status: Home / Self Care | Payer: BC Managed Care – PPO | Source: Ambulatory Visit | Attending: Gastroenterology | Admitting: Gastroenterology

## 2019-03-24 DIAGNOSIS — R1011 Right upper quadrant pain: Secondary | ICD-10-CM | POA: Insufficient documentation

## 2019-03-24 MED ORDER — TECHNETIUM TC 99M MEBROFENIN IV KIT
5.2000 | PACK | Freq: Once | INTRAVENOUS | Status: AC | PRN
  Administered 2019-03-24: 10:00:00 5.2 via INTRAVENOUS

## 2019-07-24 ENCOUNTER — Other Ambulatory Visit: Payer: Self-pay | Admitting: Internal Medicine

## 2019-07-24 DIAGNOSIS — Z1231 Encounter for screening mammogram for malignant neoplasm of breast: Secondary | ICD-10-CM

## 2019-09-01 ENCOUNTER — Other Ambulatory Visit: Payer: Self-pay

## 2019-09-01 ENCOUNTER — Ambulatory Visit
Admission: RE | Admit: 2019-09-01 | Discharge: 2019-09-01 | Disposition: A | Discharge status: Home / Self Care | Payer: BC Managed Care – PPO | Source: Ambulatory Visit | Attending: Internal Medicine | Admitting: Internal Medicine

## 2019-09-01 DIAGNOSIS — Z1231 Encounter for screening mammogram for malignant neoplasm of breast: Secondary | ICD-10-CM

## 2019-09-08 ENCOUNTER — Other Ambulatory Visit (HOSPITAL_COMMUNITY)
Admission: RE | Admit: 2019-09-08 | Discharge: 2019-09-08 | Disposition: A | Discharge status: Home / Self Care | Payer: BC Managed Care – PPO | Source: Ambulatory Visit | Attending: Internal Medicine | Admitting: Internal Medicine

## 2019-09-08 DIAGNOSIS — Z124 Encounter for screening for malignant neoplasm of cervix: Secondary | ICD-10-CM | POA: Diagnosis not present

## 2019-09-11 LAB — CYTOLOGY - PAP
Comment: NEGATIVE
Diagnosis: NEGATIVE
High risk HPV: NEGATIVE

## 2020-02-01 IMAGING — CR DG ANKLE COMPLETE 3+V*R*
3 series · 3 of 3 positions shown · non-contrast
Comparison: None.

CLINICAL DATA: Slip and fall with right ankle pain

EXAM:
RIGHT ANKLE - COMPLETE 3+ VIEW

[x ankle ap right]
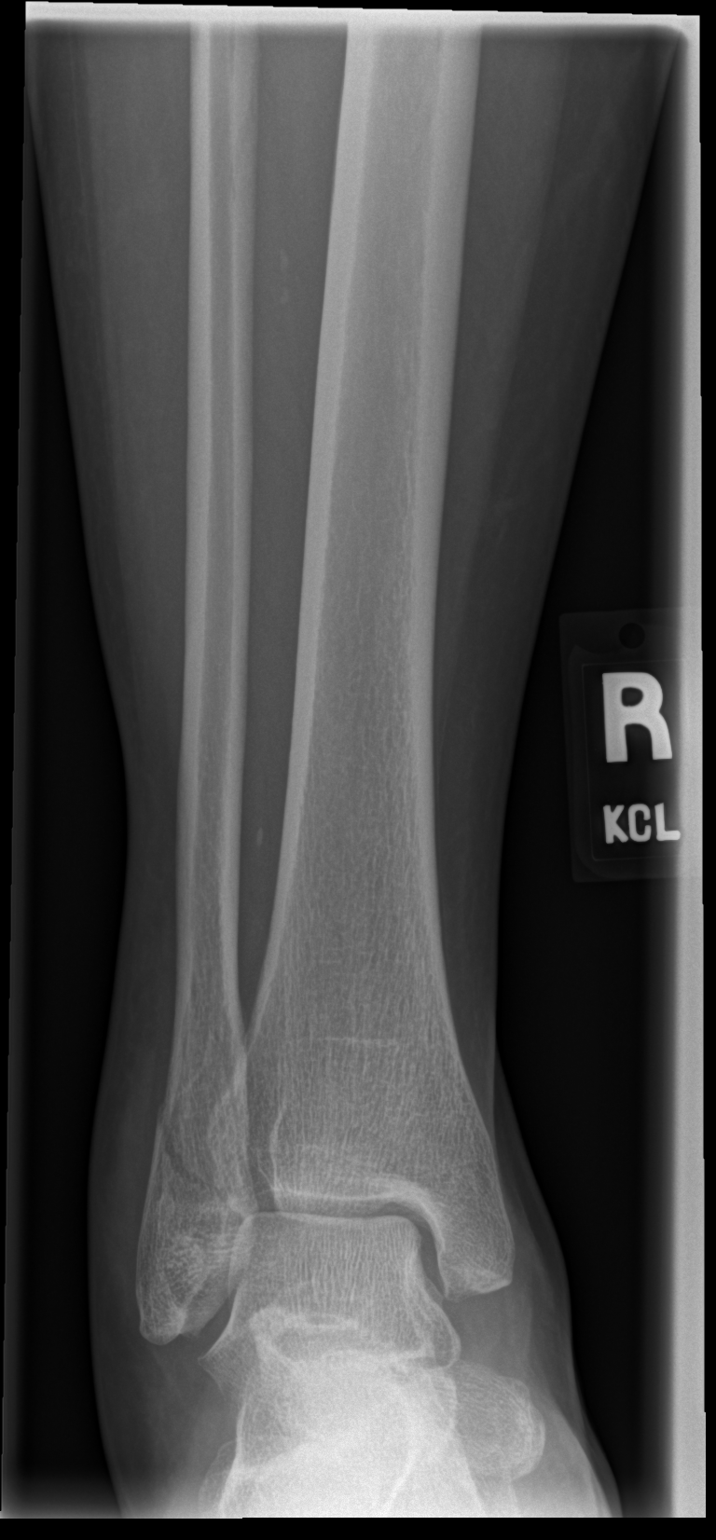

[x ankle obl right]
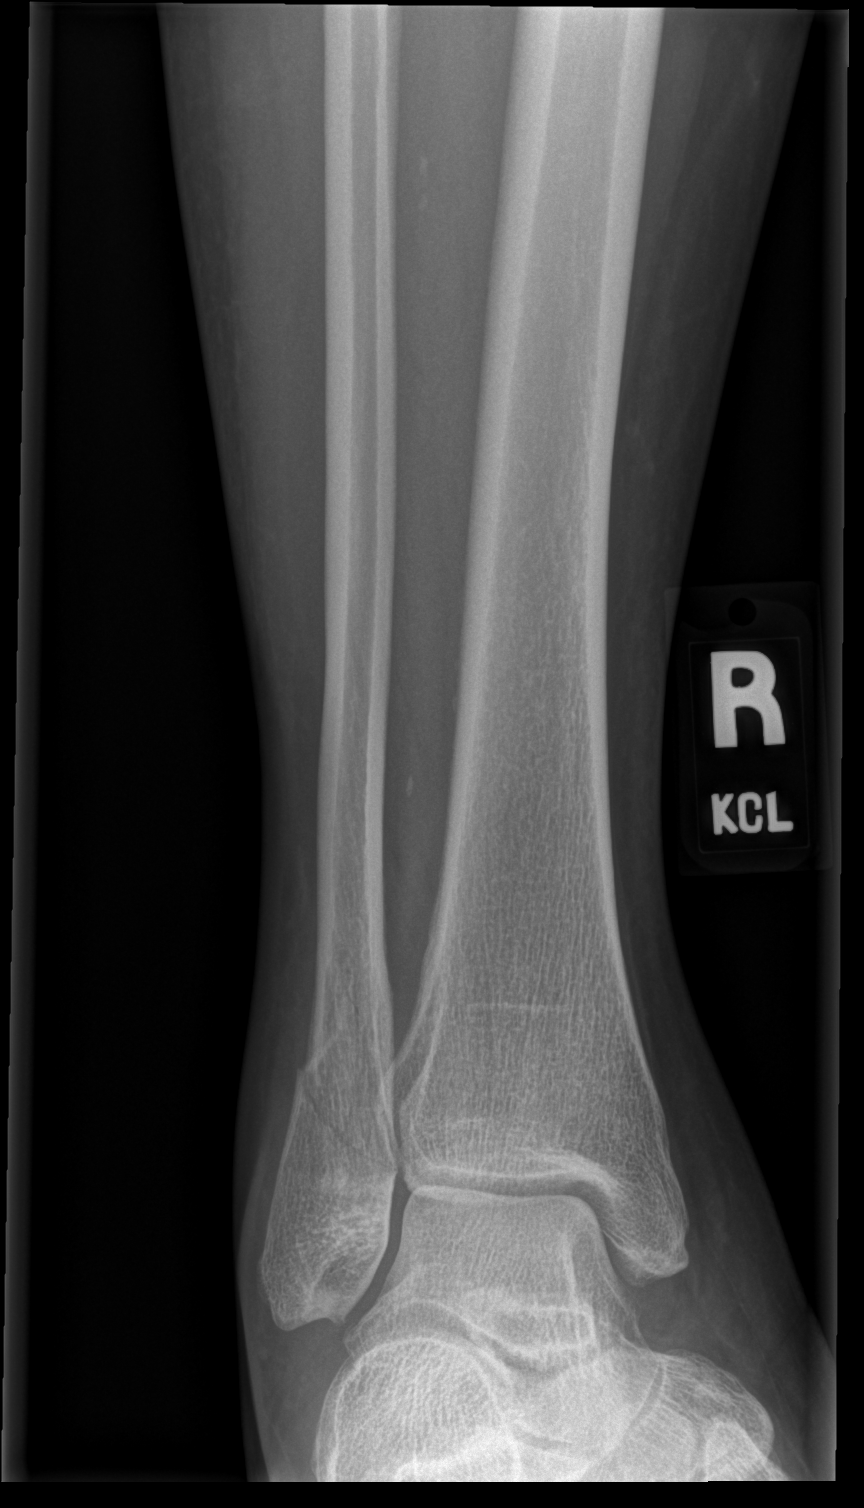

[x ankle lat right]
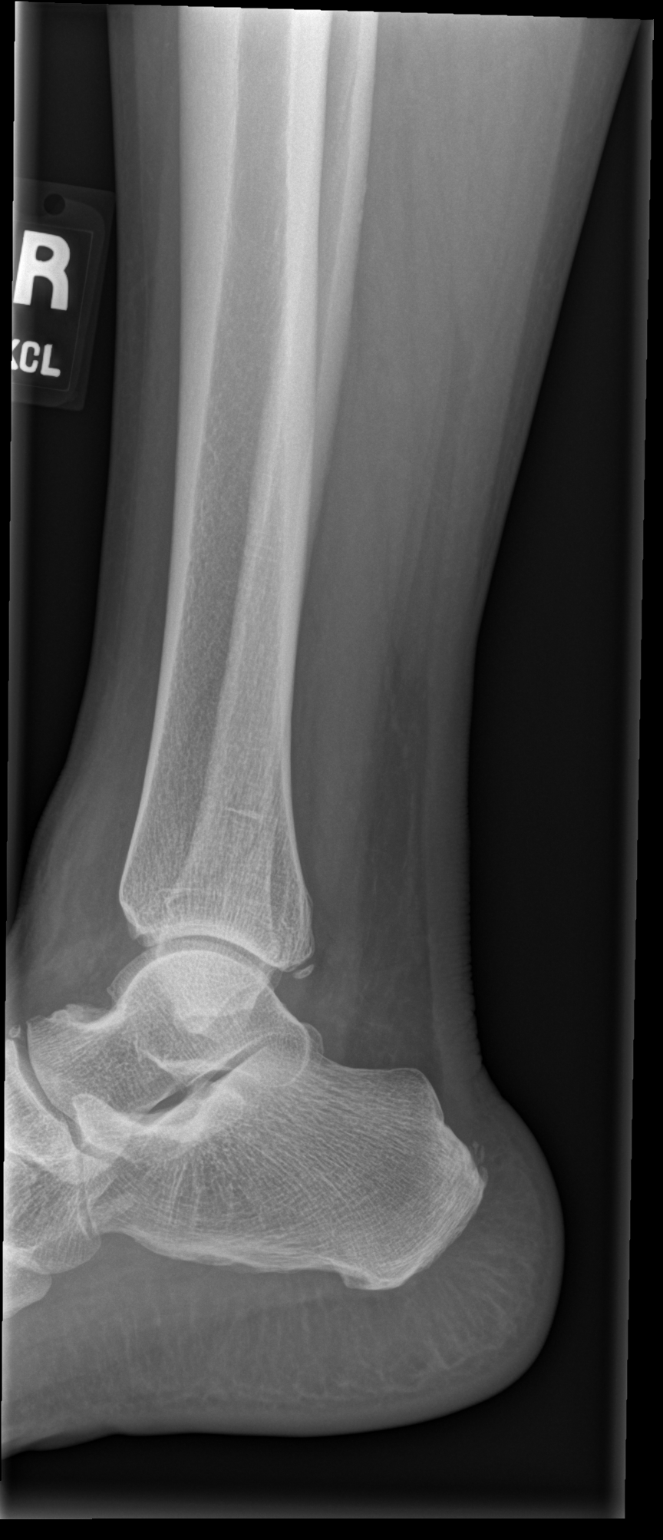

[3 of 3 positions shown; findings below may reference images not displayed]

FINDINGS: Coronal oblique fracture through the distal fibula, at and above the
ankle joint. No medial malleolus or posterior malleolus fracture is
noted. Located ankle joint.
IMPRESSION: Nondisplaced fibular fracture beginning at the ankle joint and
continuing superiorly.

## 2020-06-22 ENCOUNTER — Other Ambulatory Visit: Payer: Self-pay | Admitting: Internal Medicine

## 2020-06-22 DIAGNOSIS — Z1231 Encounter for screening mammogram for malignant neoplasm of breast: Secondary | ICD-10-CM

## 2020-08-04 ENCOUNTER — Other Ambulatory Visit: Payer: Self-pay

## 2020-08-04 ENCOUNTER — Encounter: Payer: Self-pay | Admitting: Family Medicine

## 2020-08-04 ENCOUNTER — Ambulatory Visit: Payer: BC Managed Care – PPO | Admitting: Family Medicine

## 2020-08-04 VITALS — BP 141/81 | HR 77 | Temp 97.4°F | Ht 65.0 in | Wt 168.2 lb

## 2020-08-04 DIAGNOSIS — S0340XA Sprain of jaw, unspecified side, initial encounter: Secondary | ICD-10-CM

## 2020-08-04 MED ORDER — PREDNISONE 20 MG PO TABS: ORAL_TABLET | ORAL | 0 refills | Status: AC

## 2020-08-04 MED ORDER — AZITHROMYCIN 250 MG PO TABS: ORAL_TABLET | ORAL | 0 refills | Status: AC

## 2020-08-04 NOTE — Patient Instructions (Addendum)
Temporomandibular Joint Syndrome  Temporomandibular joint syndrome (TMJ syndrome) is a condition that causes pain in the temporomandibular joints. These joints are located near your ears and allow your jaw to open and close. For people with TMJ syndrome, chewing, biting, or other movements of the jaw can be difficult or painful. TMJ syndrome is often mild and goes away within a few weeks. However, sometimes the condition becomes a long-term (chronic) problem. What are the causes? This condition may be caused by:  Grinding your teeth or clenching your jaw. Some people do this when they are under stress.  Arthritis.  Injury to the jaw.  Head or neck injury.  Teeth or dentures that are not aligned well. In some cases, the cause of TMJ syndrome may not be known. What are the signs or symptoms? The most common symptom of this condition is an aching pain on the side of the head in the area of the TMJ. Other symptoms may include:  Pain when moving your jaw, such as when chewing or biting.  Being unable to open your jaw all the way.  Making a clicking sound when you open your mouth.  Headache.  Earache.  Neck or shoulder pain. How is this diagnosed? This condition may be diagnosed based on:  Your symptoms and medical history.  A physical exam. Your health care provider may check the range of motion of your jaw.  Imaging tests, such as X-rays or an MRI. You may also need to see your dentist, who will determine if your teeth and jaw are lined up correctly. How is this treated? TMJ syndrome often goes away on its own. If treatment is needed, the options may include:  Eating soft foods and applying ice or heat.  Medicines to relieve pain or inflammation.  Medicines or massage to relax the muscles.  A splint, bite plate, or mouthpiece to prevent teeth grinding or jaw clenching.  Relaxation techniques or counseling to help reduce stress.  A therapy for pain in which an  electrical current is applied to the nerves through the skin (transcutaneous electrical nerve stimulation).  Acupuncture. This is sometimes helpful to relieve pain.  Jaw surgery. This is rarely needed. Follow these instructions at home: Eating and drinking  Eat a soft diet if you are having trouble chewing.  Avoid foods that require a lot of chewing. Do not chew gum. General instructions  Take over-the-counter and prescription medicines only as told by your health care provider.  If directed, put ice on the painful area. ? Put ice in a plastic bag. ? Place a towel between your skin and the bag. ? Leave the ice on for 20 minutes, 2-3 times a day.  Apply a warm, wet cloth (warm compress) to the painful area as directed.  Massage your jaw area and do any jaw stretching exercises as told by your health care provider.  If you were given a splint, bite plate, or mouthpiece, wear it as told by your health care provider.  Keep all follow-up visits as told by your health care provider. This is important.   Contact a health care provider if:  You are having trouble eating.  You have new or worsening symptoms. Get help right away if:  Your jaw locks open or closed. Summary  Temporomandibular joint syndrome (TMJ syndrome) is a condition that causes pain in the temporomandibular joints. These joints are located near your ears and allow your jaw to open and close.  TMJ syndrome is  often mild and goes away within a few weeks. However, sometimes the condition becomes a long-term (chronic) problem.  Symptoms include an aching pain on the side of the head in the area of the TMJ, pain when chewing or biting, and being unable to open your jaw all the way. You may also make a clicking sound when you open your mouth.  TMJ syndrome often goes away on its own. If treatment is needed, it may include medicines to relieve pain, reduce inflammation, or relax the muscles. A splint, bite plate, or  mouthpiece may also be used to prevent teeth grinding or jaw clenching. This information is not intended to replace advice given to you by your health care provider. Make sure you discuss any questions you have with your health care provider. Document Revised: 08/10/2017 Document Reviewed: 07/10/2017 Elsevier Patient Education  2021 [employer].   If you have lab work done today you will be contacted with your lab results within the next 2 weeks.  If you have not heard from [place] then please contact us. The fastest way to get your results is to register for My Chart.   IF you received an x-ray today, you will receive an invoice from [facility]. Please contact [facility] at [phone] with questions or concerns regarding your invoice.   IF you received labwork today, you will receive an invoice from [facility]. Please contact [facility] at [phone] with questions or concerns regarding your invoice.   Our billing staff will not be able to assist you with questions regarding bills from these companies.  You will be contacted with the lab results as soon as they are available. The fastest way to get your results is to activate your My Chart account. Instructions are located on the last page of this paperwork. If you have not heard from [place] regarding the results in 2 weeks, please contact this office.

## 2020-08-04 NOTE — Progress Notes (Signed)
2/23/20222:23 PM  Carolyn Morgan January 26, 1961, 60 y.o., female [id number]  Chief Complaint  Patient presents with  . right jaw pain     Going on a week    HPI:   Patient is a 60 y.o. female with past medical history significant for HTN, HLD who presents today for jaw pain.  Jaw pain started a week ago Most painful when she chews Noted slight swelling to right side of jaw The pain is in the back of the jar Last dentist visit 2 years ago Denies this happening in the past Denies popping, jaw getting stuck Has been taking ibuprofen for pain with minimal results   Depression screen [facility] 2/9 08/04/2020 11/05/2015 01/30/2015  Decreased Interest 0 0 0  Down, Depressed, Hopeless 0 0 0  PHQ - 2 Score 0 0 0    Fall Risk  08/04/2020 11/05/2015  Falls in the past year? 0 No  Number falls in past yr: 0 -  Injury with Fall? 0 -  Follow up Falls evaluation completed -     No Known Allergies  Prior to Admission medications   Medication Sig Start Date End Date Taking? Authorizing Provider  atorvastatin (LIPITOR) 10 MG tablet Take 1 tablet (10 mg total) by mouth daily. 11/05/15  Yes Jeffery, Domingo Mend, PA  lisinopril-hydrochlorothiazide (PRINZIDE,ZESTORETIC) 20-12.5 MG tablet TAKE 1 TABLET BY MOUTH EVERY DAY 11/13/16  Yes Harrison Mons, PA    Past Medical History:  Diagnosis Date  . Hyperlipidemia   . Hypertension     Past Surgical History:  Procedure Laterality Date  . ABDOMINAL HYSTERECTOMY    . CESAREAN SECTION      Social History   Tobacco Use  . Smoking status: Never Smoker  . Smokeless tobacco: Never Used  Substance Use Topics  . Alcohol use: No    Alcohol/week: 0.0 standard drinks    Family History  Problem Relation Age of Onset  . Diabetes Mother   . Diabetes Sister   . Hypertension Sister     Review of Systems  Constitutional: Negative for chills, fever, malaise/fatigue and weight loss.  HENT: Negative for congestion, sinus pain and sore throat.        Jaw  pain when eating  Respiratory: Negative for cough and sputum production.   Cardiovascular: Negative for chest pain and palpitations.  Musculoskeletal: Negative for myalgias.  Neurological: Negative for dizziness and headaches.     OBJECTIVE:  Today's Vitals   08/04/20 1359  BP: (!) 141/81  Pulse: 77  Temp: (!) 97.4 F (36.3 C)  TempSrc: Temporal  SpO2: 92%  Weight: 168 lb 3.2 oz (76.3 kg)  Height: 5\' 5"  (1.651 m)   Body mass index is 27.99 kg/m.   Physical Exam Constitutional:      General: She is not in acute distress.    Appearance: Normal appearance. She is not ill-appearing.  HENT:     Head: Normocephalic.     Jaw: There is normal jaw occlusion. Tenderness, swelling and pain on movement present. No trismus or malocclusion.     Mouth/Throat:     Mouth: Mucous membranes are moist.     Pharynx: Oropharynx is clear. No oropharyngeal exudate or posterior oropharyngeal erythema.  Cardiovascular:     Rate and Rhythm: Normal rate and regular rhythm.     Pulses: Normal pulses.     Heart sounds: Normal heart sounds. No murmur heard. No friction rub. No gallop.   Pulmonary:     Effort: Pulmonary effort is normal.  No respiratory distress.     Breath sounds: Normal breath sounds. No stridor. No wheezing, rhonchi or rales.  Abdominal:     General: Bowel sounds are normal.     Palpations: Abdomen is soft.     Tenderness: There is no abdominal tenderness.  Musculoskeletal:     Right lower leg: No edema.     Left lower leg: No edema.  Skin:    General: Skin is warm and dry.  Neurological:     Mental Status: She is alert and oriented to person, place, and time.  Psychiatric:        Mood and Affect: Mood normal.        Behavior: Behavior normal.     No results found for this or any previous visit (from the past 24 hour(s)).  No results found.   ASSESSMENT and PLAN  Problem List Items Addressed This Visit   None   Visit Diagnoses    Sprain of temporomandibular  joint, initial encounter    -  Primary   Relevant Medications   predniSONE (DELTASONE) 20 MG tablet   azithromycin (ZITHROMAX) 250 MG tablet      Plan . RTC/ED precautions provided . R/se/b of medications discussed . Encouraged dental follow up . Ibuprofen for pain as needed   Return if symptoms worsen or fail to improve.    Huston Foley Azlyn Wingler, FNP-BC [facility] [place], [place] [address] Ph.  [phone] Fax [phone]

## 2020-09-01 ENCOUNTER — Ambulatory Visit: Payer: BC Managed Care – PPO

## 2020-09-09 ENCOUNTER — Other Ambulatory Visit: Payer: Self-pay | Admitting: Internal Medicine

## 2020-09-09 DIAGNOSIS — E2839 Other primary ovarian failure: Secondary | ICD-10-CM

## 2020-10-13 ENCOUNTER — Ambulatory Visit
Admission: RE | Admit: 2020-10-13 | Discharge: 2020-10-13 | Disposition: A | Discharge status: Home / Self Care | Payer: BC Managed Care – PPO | Source: Ambulatory Visit | Attending: Internal Medicine | Admitting: Internal Medicine

## 2020-10-13 ENCOUNTER — Other Ambulatory Visit: Payer: Self-pay

## 2020-10-13 DIAGNOSIS — E2839 Other primary ovarian failure: Secondary | ICD-10-CM

## 2020-10-15 ENCOUNTER — Ambulatory Visit: Payer: BC Managed Care – PPO | Admitting: Podiatry

## 2020-10-15 ENCOUNTER — Other Ambulatory Visit: Payer: Self-pay

## 2020-10-15 DIAGNOSIS — B351 Tinea unguium: Secondary | ICD-10-CM | POA: Diagnosis not present

## 2020-10-15 DIAGNOSIS — E2839 Other primary ovarian failure: Secondary | ICD-10-CM | POA: Insufficient documentation

## 2020-10-15 DIAGNOSIS — E785 Hyperlipidemia, unspecified: Secondary | ICD-10-CM | POA: Insufficient documentation

## 2020-10-15 DIAGNOSIS — D3A026 Benign carcinoid tumor of the rectum: Secondary | ICD-10-CM

## 2020-10-15 DIAGNOSIS — E7800 Pure hypercholesterolemia, unspecified: Secondary | ICD-10-CM

## 2020-10-15 DIAGNOSIS — Z90711 Acquired absence of uterus with remaining cervical stump: Secondary | ICD-10-CM

## 2020-10-15 DIAGNOSIS — R911 Solitary pulmonary nodule: Secondary | ICD-10-CM | POA: Insufficient documentation

## 2020-10-15 DIAGNOSIS — B353 Tinea pedis: Secondary | ICD-10-CM

## 2020-10-15 HISTORY — DX: Pure hypercholesterolemia, unspecified: E78.00

## 2020-10-15 HISTORY — DX: Solitary pulmonary nodule: R91.1

## 2020-10-15 HISTORY — DX: Hyperlipidemia, unspecified: E78.5

## 2020-10-15 HISTORY — DX: Benign carcinoid tumor of the rectum: D3A.026

## 2020-10-15 HISTORY — DX: Acquired absence of uterus with remaining cervical stump: Z90.711

## 2020-10-15 HISTORY — DX: Other primary ovarian failure: E28.39

## 2020-10-15 MED ORDER — FLUCONAZOLE 150 MG PO TABS: 150.0000 mg | ORAL_TABLET | ORAL | 2 refills | Status: DC

## 2020-10-15 MED ORDER — KETOCONAZOLE 2 % EX CREA: TOPICAL_CREAM | CUTANEOUS | 0 refills | Status: DC

## 2020-10-15 MED ORDER — CICLOPIROX 8 % EX SOLN: Freq: Every day | CUTANEOUS | 0 refills | Status: DC

## 2020-10-15 NOTE — Progress Notes (Signed)
  Subjective:  Patient ID: Carolyn Morgan, female    DOB: 1960-09-30,  MRN: [id number]  Chief Complaint  Patient presents with  . Nail Problem    Pt states she has nail fungus for 5 years which "comes and goes". Pt would like treatment to get rid of the fungus.    60 y.o. female presents with the above complaint. History confirmed with patient.   Objective:  Physical Exam: warm, good capillary refill, no trophic changes or ulcerative lesions, normal DP and PT pulses and normal sensory exam. Right hallux nail thickened dystrophic, subungual debris, black discoloration. Xerotic circular scaling plantar foot bilat. Assessment:   1. Onychomycosis   2. Tinea pedis due to epidermophyton    Plan:  Patient was evaluated and treated and all questions answered.  Onychomycosis Onychomycosis -Educated on etiology of nail fungus. -Nail sample taken for microbiology and histology. -eRx for oral fluconazole. Educated on risks and benefits of the medication. -Topical penlac and ketoconazole for tinea pedis.  Return in about 6 weeks (around 11/26/2020) for Nail Fungus.

## 2020-10-21 ENCOUNTER — Other Ambulatory Visit: Payer: Self-pay

## 2020-10-21 ENCOUNTER — Ambulatory Visit
Admission: RE | Admit: 2020-10-21 | Discharge: 2020-10-21 | Disposition: A | Discharge status: Home / Self Care | Payer: BC Managed Care – PPO | Source: Ambulatory Visit | Attending: Internal Medicine | Admitting: Internal Medicine

## 2020-10-21 DIAGNOSIS — Z1231 Encounter for screening mammogram for malignant neoplasm of breast: Secondary | ICD-10-CM

## 2020-11-23 ENCOUNTER — Ambulatory Visit: Payer: BC Managed Care – PPO | Admitting: Podiatry

## 2020-11-26 ENCOUNTER — Ambulatory Visit: Payer: BC Managed Care – PPO | Admitting: Podiatry

## 2020-12-04 ENCOUNTER — Ambulatory Visit: Payer: BC Managed Care – PPO | Admitting: Podiatry

## 2020-12-04 ENCOUNTER — Other Ambulatory Visit: Payer: Self-pay

## 2020-12-04 DIAGNOSIS — B351 Tinea unguium: Secondary | ICD-10-CM

## 2020-12-04 DIAGNOSIS — B353 Tinea pedis: Secondary | ICD-10-CM | POA: Diagnosis not present

## 2020-12-04 MED ORDER — CICLOPIROX 8 % EX SOLN: Freq: Every day | CUTANEOUS | 0 refills | Status: DC

## 2020-12-04 NOTE — Progress Notes (Signed)
  Subjective:  Patient ID: Carolyn Morgan, female    DOB: 1960-08-20,  MRN: [id number]  Chief Complaint  Patient presents with   Nail Problem    2 month follow up nail fungus   60 y.o. female presents with the above complaint. History confirmed with patient. Hinks the nails is doing better and with clear growth.  Objective:  Physical Exam: warm, good capillary refill, no trophic changes or ulcerative lesions, normal DP and PT pulses and normal sensory exam. Right hallux nail thickened dystrophic, subungual debris, black discoloration. Xerotic circular scaling plantar foot bilat. Assessment:   1. Onychomycosis   2. Tinea pedis due to epidermophyton     Plan:  Patient was evaluated and treated and all questions answered.  Onychomycosis -Much improved -No need for further fluconazole -Rx penlac for topical use. -Nail debrided, slight ingrown nail noted today.  No follow-ups on file.

## 2021-08-31 ENCOUNTER — Other Ambulatory Visit: Payer: Self-pay

## 2021-08-31 ENCOUNTER — Emergency Department
Admission: EM | Admit: 2021-08-31 | Discharge: 2021-08-31 | Disposition: A | Discharge status: Home / Self Care | Payer: BC Managed Care – PPO | Source: Home / Self Care | Attending: Family Medicine | Admitting: Family Medicine

## 2021-08-31 DIAGNOSIS — R457 State of emotional shock and stress, unspecified: Secondary | ICD-10-CM

## 2021-08-31 DIAGNOSIS — I10 Essential (primary) hypertension: Secondary | ICD-10-CM | POA: Diagnosis not present

## 2021-08-31 MED ORDER — ZOLPIDEM TARTRATE 5 MG PO TABS: 5.0000 mg | ORAL_TABLET | Freq: Every evening | ORAL | 0 refills | Status: DC | PRN

## 2021-08-31 MED ORDER — ATENOLOL 25 MG PO TABS: 25.0000 mg | ORAL_TABLET | Freq: Every day | ORAL | 0 refills | Status: DC

## 2021-08-31 NOTE — ED Triage Notes (Signed)
Pt presents with c/o HTN due to stress from work. Pt states hight BP was 185/102. Last BP 177/100 today. Pt endorses light headedness. Pt states she has also not been sleeping well.  ?

## 2021-08-31 NOTE — Discharge Instructions (Signed)
I am adding atenolol once a day for blood pressure.  It will slightly slow down your heart rate and keep you from having rapid heartbeats.  Take 1 a day ?I am prescribing Ambien to take to help sleep.  Take right before you lie down.  Take as needed. ?Call your doctors for an appointment early next week ?

## 2021-08-31 NOTE — ED Provider Notes (Signed)
?[place] ? ? ? ?CSN: [id number] ?Arrival date & time: 08/31/21  4196 ? ? ?  ? ?History   ?Chief Complaint ?Chief Complaint  ?Patient presents with  ? Hypertension  ? ? ?HPI ?Carolyn Morgan is a 61 y.o. female.  ? ?HPI ?Patient has a primary care doctor through [facility].  She called today to be seen but was told her doctor is out of the office for a week.  She therefore came here for evaluation.  She states that her job has been increasingly stressful for her recently.  Job changes present difficulty for her.  She is not sleeping.  Her blood pressure has been elevated.  She takes her blood pressure every day.  She states it usually does not go higher than 134/80.  She states her blood pressure has been going up and down for the last week, and has been high since yesterday.  She states this is very worrisome for her.  She states she did not sleep last night because she was so worried about her job, her stress, her blood pressure ?She denies any headache, change in vision, or dizzy spells.  She states at times she feels lightheaded and like she cannot concentrate.  She acknowledges this may be from the stress.  She states at times her heart beats rapidly.  She also thinks this is stress related.  Denies any chest pain or pressure ? ?Past Medical History:  ?Diagnosis Date  ? Hyperlipidemia   ? Hypertension   ? ? ?Patient Active Problem List  ? Diagnosis Date Noted  ? 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 Decreased estrogen level 10/15/2020  ? Dyslipidemia 10/15/2020  ? Pure hypercholesterolemia 10/15/2020  ? Solitary pulmonary nodule 10/15/2020  ? Nondisplaced fracture of lateral malleolus of right fibula, initial encounter for closed fracture 01/22/2018  ? Benign essential HTN 11/05/2015  ? Hyperlipidemia 11/05/2015  ? History of colonic polyps 11/05/2015  ? Uric acid crystalluria 10/16/2014  ? Microscopic hematuria  10/13/2014  ? ? ?Past Surgical History:  ?Procedure Laterality Date  ? ABDOMINAL HYSTERECTOMY    ? CESAREAN SECTION    ? ? ?OB History   ?No obstetric history on file. ?  ? ? ? ?Home Medications   ? ?Prior to Admission medications   ?Medication Sig Start Date End Date Taking? Authorizing Provider  ?atenolol (TENORMIN) 25 MG tablet Take 1 tablet (25 mg total) by mouth daily. 08/31/21  Yes Raylene Everts, MD  ?zolpidem (AMBIEN) 5 MG tablet Take 1 tablet (5 mg total) by mouth at bedtime as needed for sleep. 08/31/21  Yes Raylene Everts, MD  ?atorvastatin (LIPITOR) 10 MG tablet Take 1 tablet (10 mg total) by mouth daily. 11/05/15   Harrison Mons, PA  ?lisinopril-hydrochlorothiazide (PRINZIDE,ZESTORETIC) 20-12.5 MG tablet TAKE 1 TABLET BY MOUTH EVERY DAY 11/13/16   Harrison Mons, PA  ? ? ?Family History ?Family History  ?Problem Relation Age of Onset  ? Diabetes Mother   ? Diabetes Sister   ? Hypertension Sister   ? ? ?Social History ?Social History  ? ?Tobacco Use  ? Smoking status: Never  ? Smokeless tobacco: Never  ?Substance Use Topics  ? Alcohol use: No  ?  Alcohol/week: 0.0 standard drinks  ? Drug use: No  ? ? ? ?Allergies   ?Griseofulvin ? ? ?Review of Systems ?Review of Systems ?See HPI ? ?Physical Exam ?Triage Vital Signs ?ED Triage  Vitals  ?Enc Vitals Group  ?   BP 08/31/21 1001 (!) 164/103  ?   Pulse Rate 08/31/21 1001 95  ?   Resp 08/31/21 1001 14  ?   Temp 08/31/21 1001 99.1 ?F (37.3 ?C)  ?   Temp Source 08/31/21 1001 Oral  ?   SpO2 08/31/21 1001 97 %  ?   Weight 08/31/21 1002 162 lb (73.5 kg)  ?   Height --   ?   Head Circumference --   ?   Peak Flow --   ?   Pain Score 08/31/21 1002 0  ?   Pain Loc --   ?   Pain Edu? --   ?   Excl. in [place]? --   ? ?No data found. ? ?Updated Vital Signs ?BP (!) 164/103 (BP Location: Left Arm)   Pulse 95   Temp 99.1 ?F (37.3 ?C) (Oral)   Resp 14   Wt 73.5 kg   SpO2 97%   BMI 26.96 kg/m?    ? ?Physical Exam ?Constitutional:   ?   General: She is not in acute  distress. ?   Appearance: Normal appearance. She is well-developed.  ?   Comments: Blood pressure repeated manually by myself.  160/98  ?HENT:  ?   Head: Normocephalic and atraumatic.  ?   Mouth/Throat:  ?   Comments: Mask is in place ?Eyes:  ?   Conjunctiva/sclera: Conjunctivae normal.  ?   Pupils: Pupils are equal, round, and reactive to light.  ?Cardiovascular:  ?   Rate and Rhythm: Normal rate and regular rhythm.  ?   Heart sounds: Normal heart sounds.  ?Pulmonary:  ?   Effort: Pulmonary effort is normal. No respiratory distress.  ?   Breath sounds: Normal breath sounds.  ?Abdominal:  ?   General: There is no distension.  ?   Palpations: Abdomen is soft.  ?Musculoskeletal:     ?   General: Normal range of motion.  ?   Cervical back: Normal range of motion.  ?Skin: ?   General: Skin is warm and dry.  ?Neurological:  ?   Mental Status: She is alert.  ?Psychiatric:     ?   Mood and Affect: Mood normal.     ?   Behavior: Behavior normal.  ? ? ? ?UC Treatments / Results  ?Labs ?(all labs ordered are listed, but only abnormal results are displayed) ?Labs Reviewed - No data to display ? ?EKG ? ? ?Radiology ?No results found. ? ?Procedures ?Procedures (including critical care time) ? ?Medications Ordered in UC ?Medications - No data to display ? ?Initial Impression / Assessment and Plan / UC Course  ?I have reviewed the triage vital signs and the nursing notes. ? ?Pertinent labs & imaging results that were available during my care of the patient were reviewed by me and considered in my medical decision making (see chart for details). ? ?  ? ?Final Clinical Impressions(s) / UC Diagnoses  ? ?Final diagnoses:  ?Hypertension, poor control  ?Emotional stress  ? ? ? ?Discharge Instructions   ? ?  ?I am adding atenolol once a day for blood pressure.  It will slightly slow down your heart rate and keep you from having rapid heartbeats.  Take 1 a day ?I am prescribing Ambien to take to help sleep.  Take right before you lie  down.  Take as needed. ?Call your doctors for an appointment early next week ? ? ?ED Prescriptions   ? ?  Medication Sig Dispense Auth. Provider  ? atenolol (TENORMIN) 25 MG tablet Take 1 tablet (25 mg total) by mouth daily. 30 tablet Raylene Everts, MD  ? zolpidem (AMBIEN) 5 MG tablet Take 1 tablet (5 mg total) by mouth at bedtime as needed for sleep. 10 tablet Raylene Everts, MD  ? ?  ? ?PDMP not reviewed this encounter. ?  ?Raylene Everts, MD ?08/31/21 1140 ? ?

## 2021-10-17 ENCOUNTER — Other Ambulatory Visit: Payer: Self-pay | Admitting: Internal Medicine

## 2021-10-17 DIAGNOSIS — Z1231 Encounter for screening mammogram for malignant neoplasm of breast: Secondary | ICD-10-CM

## 2021-10-20 DIAGNOSIS — Z1231 Encounter for screening mammogram for malignant neoplasm of breast: Secondary | ICD-10-CM

## 2021-10-27 ENCOUNTER — Ambulatory Visit (INDEPENDENT_AMBULATORY_CARE_PROVIDER_SITE_OTHER): Payer: BC Managed Care – PPO

## 2021-10-27 DIAGNOSIS — Z1231 Encounter for screening mammogram for malignant neoplasm of breast: Secondary | ICD-10-CM | POA: Diagnosis not present

## 2022-10-11 ENCOUNTER — Other Ambulatory Visit: Payer: Self-pay | Admitting: Internal Medicine

## 2022-10-11 DIAGNOSIS — Z1231 Encounter for screening mammogram for malignant neoplasm of breast: Secondary | ICD-10-CM

## 2022-10-25 ENCOUNTER — Encounter: Payer: Self-pay | Admitting: Emergency Medicine

## 2022-10-25 ENCOUNTER — Ambulatory Visit
Admission: EM | Admit: 2022-10-25 | Discharge: 2022-10-25 | Disposition: A | Discharge status: Home / Self Care | Payer: BC Managed Care – PPO | Attending: Physician Assistant | Admitting: Physician Assistant

## 2022-10-25 DIAGNOSIS — S20461A Insect bite (nonvenomous) of right back wall of thorax, initial encounter: Secondary | ICD-10-CM

## 2022-10-25 DIAGNOSIS — W57XXXA Bitten or stung by nonvenomous insect and other nonvenomous arthropods, initial encounter: Secondary | ICD-10-CM | POA: Diagnosis not present

## 2022-10-25 MED ORDER — DOXYCYCLINE HYCLATE 100 MG PO CAPS: 100.0000 mg | ORAL_CAPSULE | Freq: Two times a day (BID) | ORAL | 0 refills | Status: DC

## 2022-10-25 NOTE — ED Provider Notes (Signed)
[facility]    CSN: [id number] Arrival date & time: 10/25/22  1145      History   Chief Complaint Chief Complaint  Patient presents with   Insect Bite    HPI Carolyn Morgan is a 62 y.o. female.   Patient reports that she pulled a tick off of her back.  Patient reports that she has had a since removing.  Patient shows me of a that is ankle removed.  Patient denies any fever or chills  The history is provided by the patient. No language interpreter was used.    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      Home Medications    Prior to Admission medications   Medication Sig Start Date End Date Taking? Authorizing Provider  doxycycline (VIBRAMYCIN) 100 MG capsule Take 1 capsule (100 mg total) by mouth 2 (two) times daily. 10/25/22  Yes Cheron Schaumann K, PA-C  atenolol (TENORMIN) 25 MG tablet Take 1 tablet (25 mg total) by mouth daily. 08/31/21   Eustace Moore, MD  atorvastatin (LIPITOR) 10 MG tablet Take 1 tablet (10 mg total) by mouth daily. 11/05/15   Porfirio Oar, PA  lisinopril-hydrochlorothiazide (PRINZIDE,ZESTORETIC) 20-12.5 MG tablet TAKE 1 TABLET BY MOUTH EVERY DAY 11/13/16    Porfirio Oar, PA  zolpidem (AMBIEN) 5 MG tablet Take 1 tablet (5 mg total) by mouth at bedtime as needed for sleep. Patient not taking: Reported on 10/25/2022 08/31/21   Eustace Moore, MD    Family History Family History  Problem Relation Age of Onset   Diabetes Mother    Diabetes Sister    Hypertension Sister     Social History Social History   Tobacco Use   Smoking status: Never   Smokeless tobacco: Never  Vaping Use   Vaping Use: Never used  Substance Use Topics   Alcohol use: No    Alcohol/week: 0.0 standard drinks of alcohol   Drug use: No     Allergies   Griseofulvin   Review of Systems Review of Systems  All other systems reviewed and are negative.    Physical Exam Triage Vital Signs ED Triage Vitals  Enc Vitals Group     BP 10/25/22 1200 136/79     Pulse Rate 10/25/22 1200 66     Resp 10/25/22 1200 14     Temp 10/25/22 1200 99.3 F (37.4 C)     Temp Source 10/25/22 1200 Oral     SpO2 10/25/22 1200 98 %     Weight 10/25/22 1202 162 lb (73.5 kg)     Height 10/25/22 1202 5\' 5"  (1.651 m)     Head Circumference --  Peak Flow --      Pain Score 10/25/22 1202 2     Pain Loc --      Pain Edu? --      Excl. in [place]? --    No data found.  Updated Vital Signs BP 136/79 (BP Location: Left Arm)   Pulse 66   Temp 99.3 F (37.4 C) (Oral)   Resp 14   Ht 5\' 5"  (1.651 m)   Wt 73.5 kg   SpO2 98%   BMI 26.96 kg/m   Visual Acuity Right Eye Distance:   Left Eye Distance:   Bilateral Distance:    Right Eye Near:   Left Eye Near:    Bilateral Near:     Physical Exam Vitals reviewed.  Constitutional:      Appearance: Normal appearance.  Cardiovascular:     Rate and Rhythm: Normal rate.  Pulmonary:     Effort: Pulmonary effort is normal.  Musculoskeletal:     Comments: 1 cm dark area right upper back  Skin:    General: Skin is warm.  Neurological:     General: No focal deficit present.     Mental Status: She is alert.  Psychiatric:         Mood and Affect: Mood normal.      UC Treatments / Results  Labs (all labs ordered are listed, but only abnormal results are displayed) Labs Reviewed - No data to display  EKG   Radiology No results found.  Procedures Procedures (including critical care time)  Medications Ordered in UC Medications - No data to display  Initial Impression / Assessment and Plan / UC Course  I have reviewed the triage vital signs and the nursing notes.  Pertinent labs & imaging results that were available during my care of the patient were reviewed by me and considered in my medical decision making (see chart for details).     MDM: Patient counseled on tick related illnesses patient given a prescription for doxycycline she is advised to follow-up with her primary care physician for recheck. Final Clinical Impressions(s) / UC Diagnoses   Final diagnoses:  Tick bite of right back wall of thorax, initial encounter     Discharge Instructions      Return if any problems.     ED Prescriptions     Medication Sig Dispense Auth. Provider   doxycycline (VIBRAMYCIN) 100 MG capsule Take 1 capsule (100 mg total) by mouth 2 (two) times daily. 20 capsule Elson Areas, [place]      PDMP not reviewed this encounter. An After Visit Summary was printed and given to the patient.       Elson Areas, [place] 10/25/22 1456

## 2022-10-25 NOTE — ED Triage Notes (Addendum)
Insect bite to right scapulae on Friday working in the yard Red edges w/ dark center  Painful  Denies drainage

## 2022-10-25 NOTE — Discharge Instructions (Signed)
Return if any problems.

## 2022-11-14 ENCOUNTER — Ambulatory Visit
Admission: RE | Admit: 2022-11-14 | Discharge: 2022-11-14 | Disposition: A | Discharge status: Home / Self Care | Payer: BC Managed Care – PPO | Source: Ambulatory Visit | Attending: Internal Medicine | Admitting: Internal Medicine

## 2022-11-14 DIAGNOSIS — Z1231 Encounter for screening mammogram for malignant neoplasm of breast: Secondary | ICD-10-CM

## 2022-12-13 IMAGING — MG MM DIGITAL SCREENING BILAT W/ TOMO AND CAD
8 series · 8 of 24 positions shown · non-contrast
Comparison: Previous exam(s).

CLINICAL DATA: Screening.

EXAM:
DIGITAL SCREENING BILATERAL MAMMOGRAM WITH TOMOSYNTHESIS AND CAD
TECHNIQUE: Bilateral screening digital craniocaudal and mediolateral oblique
mammograms were obtained. Bilateral screening digital breast
tomosynthesis was performed. The images were evaluated with
computer-aided detection.

[R CC synth-2D]
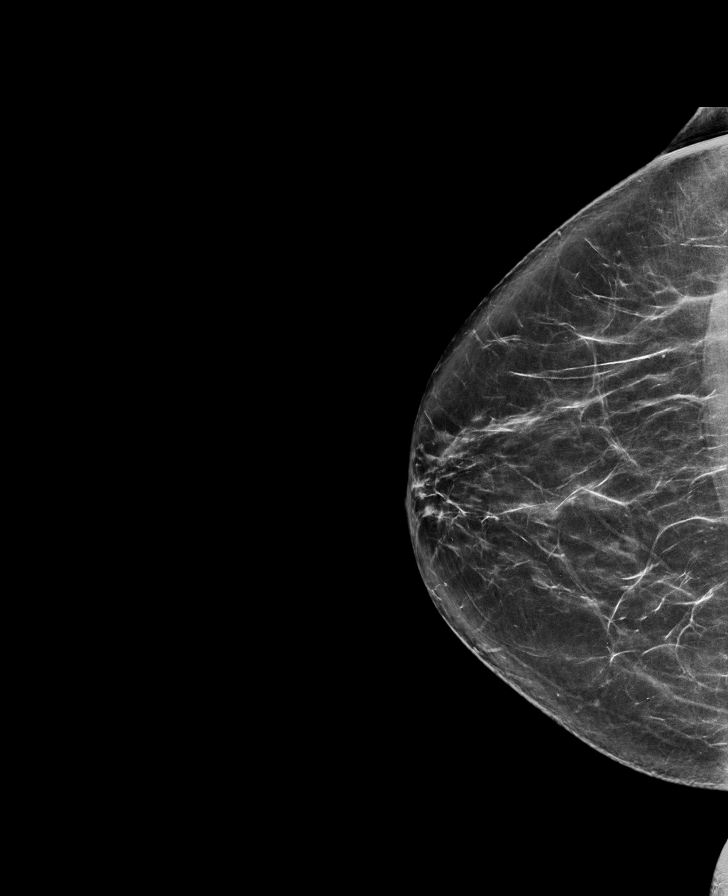

[L MLO synth-2D]
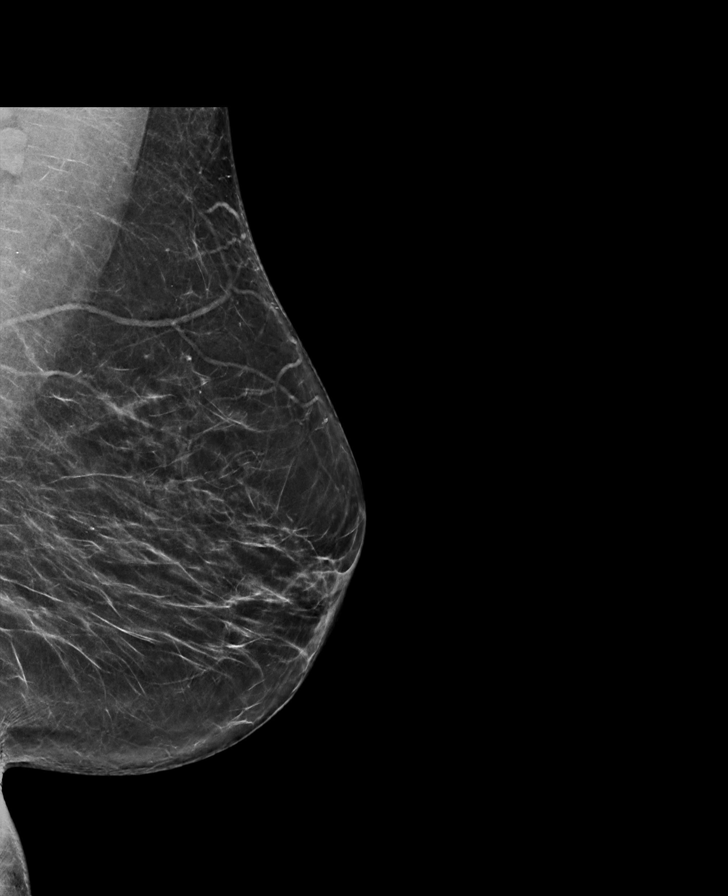

[L CC synth-2D]
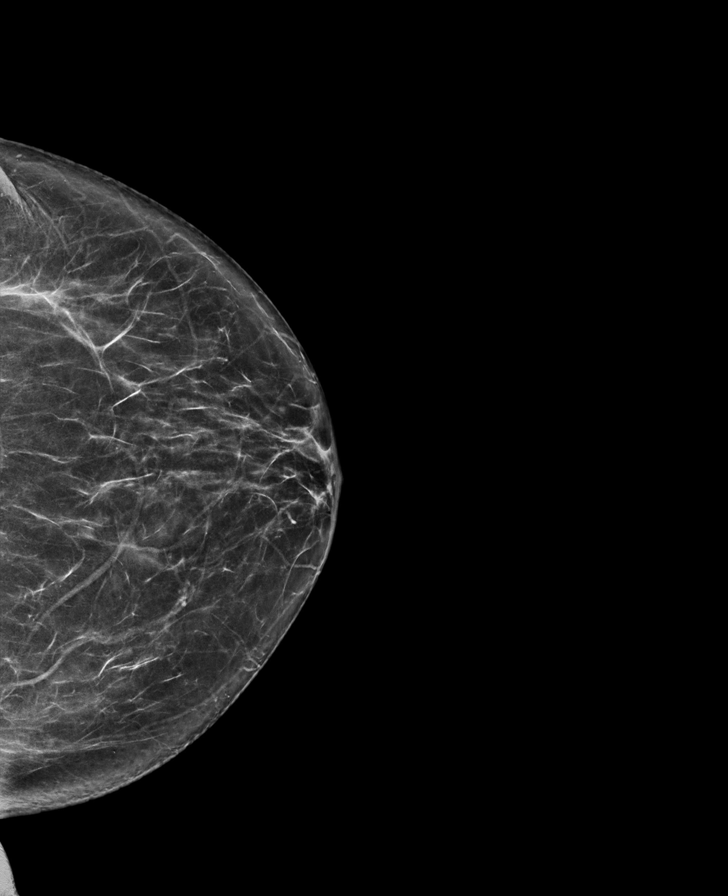

[R MLO synth-2D]
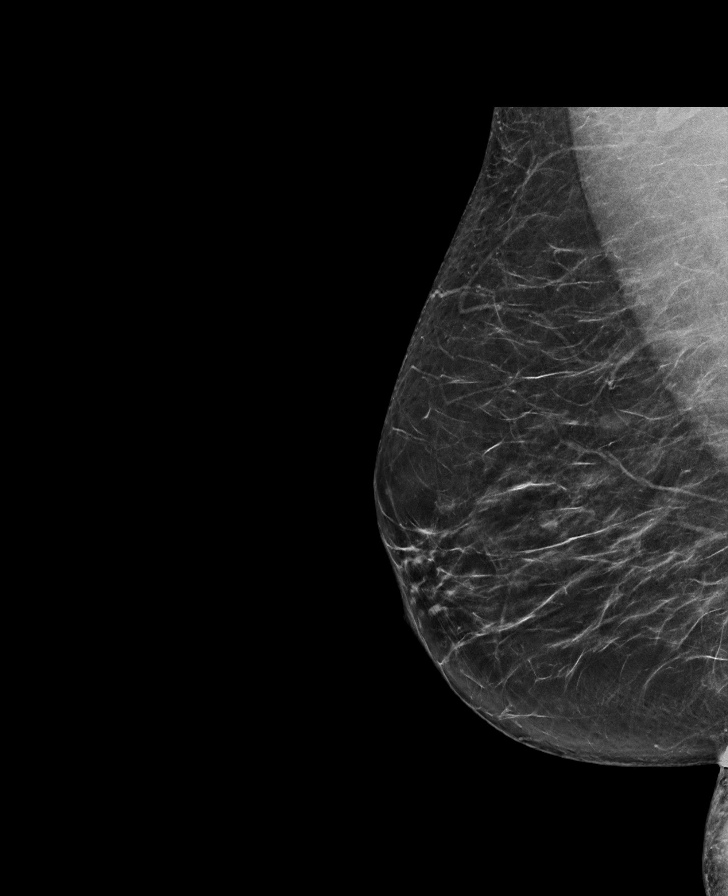

[L CC tomo · tomo slice 35/68.0]
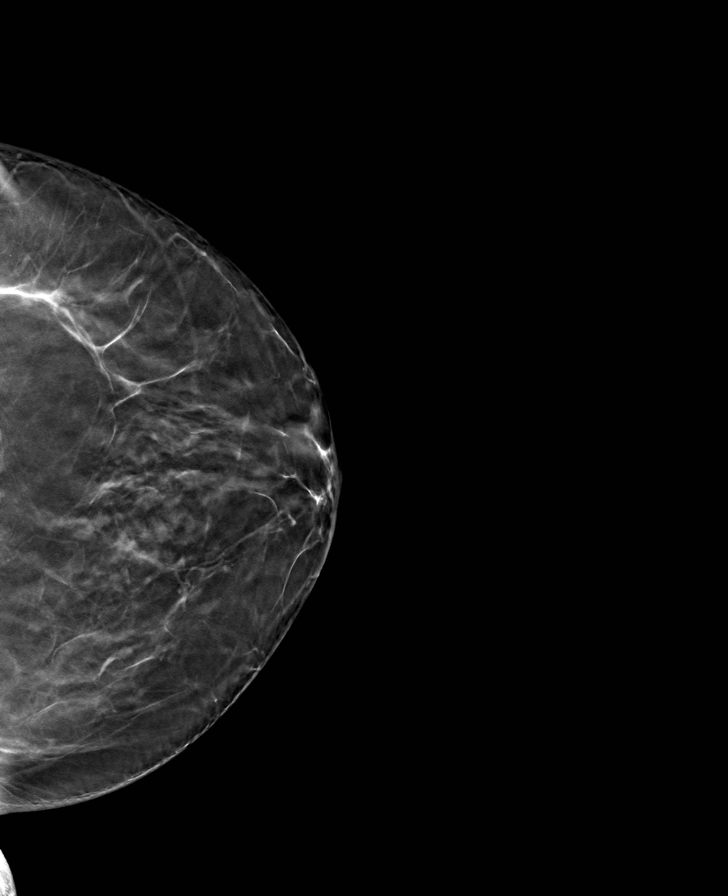

[R CC tomo · tomo slice 37/74.0]
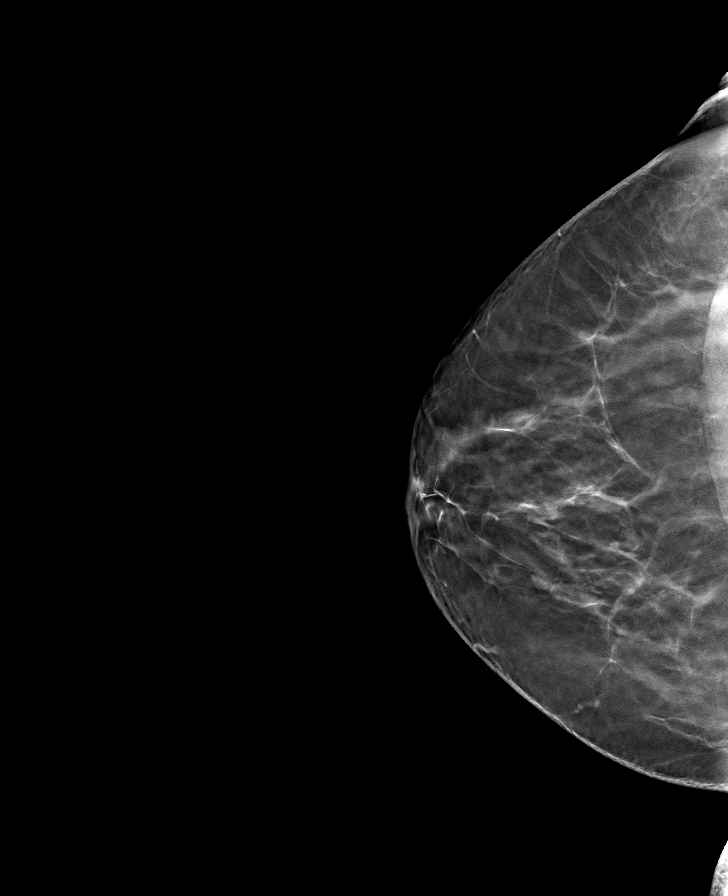

[L MLO tomo · tomo slice 37/72.0]
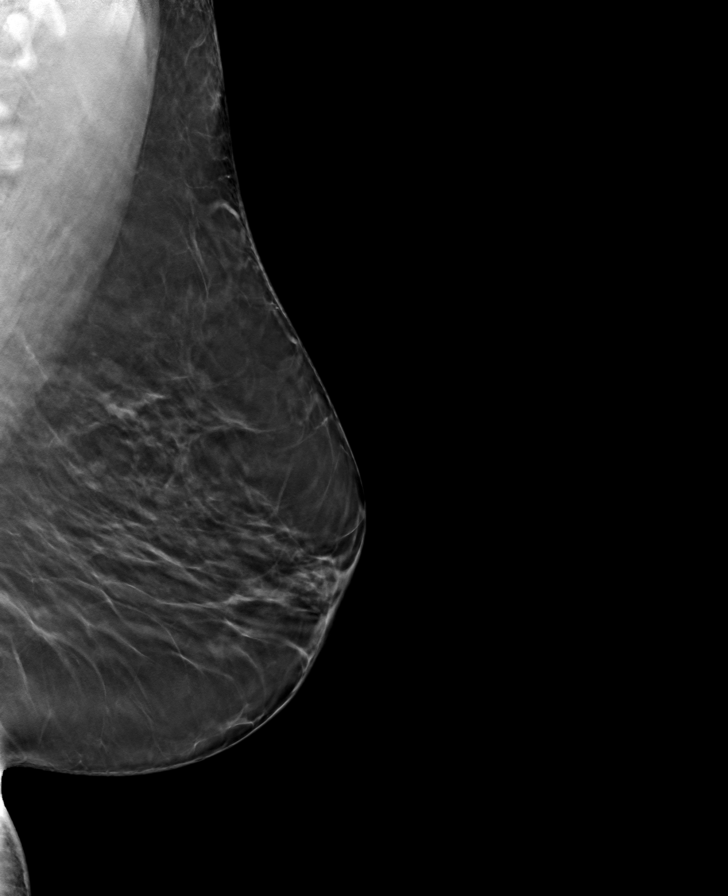

[R MLO tomo · tomo slice 35/70.0]
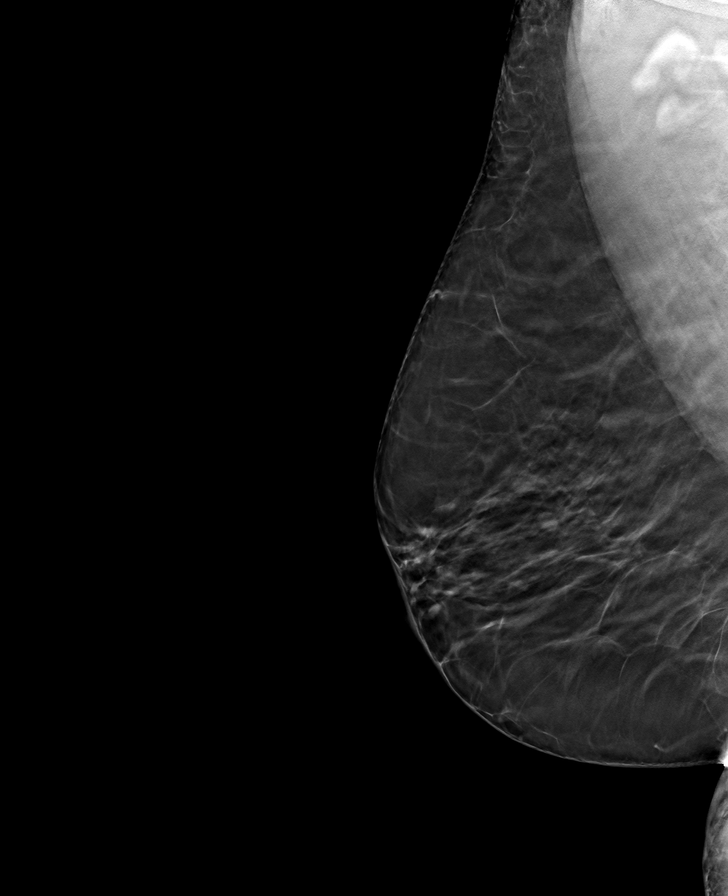

[8 of 24 positions shown; findings below may reference images not displayed]

ACR Breast Density Category b: There are scattered areas of
fibroglandular density.
FINDINGS: There are no findings suspicious for malignancy. The images were
evaluated with computer-aided detection.
IMPRESSION: No mammographic evidence of malignancy. A result letter of this
screening mammogram will be mailed directly to the patient.

RECOMMENDATION:
Screening mammogram in one year. (Code:WJ-I-BG6)

BI-RADS CATEGORY  1: Negative.

## 2023-08-29 ENCOUNTER — Other Ambulatory Visit: Payer: Self-pay

## 2023-08-29 DIAGNOSIS — R9431 Abnormal electrocardiogram [ECG] [EKG]: Secondary | ICD-10-CM | POA: Insufficient documentation

## 2023-08-29 DIAGNOSIS — I10 Essential (primary) hypertension: Secondary | ICD-10-CM | POA: Insufficient documentation

## 2023-08-30 ENCOUNTER — Ambulatory Visit

## 2023-08-30 VITALS — BP 152/96 | HR 54 | Ht 65.0 in | Wt 169.4 lb

## 2023-08-30 DIAGNOSIS — R5383 Other fatigue: Secondary | ICD-10-CM

## 2023-08-30 DIAGNOSIS — R001 Bradycardia, unspecified: Secondary | ICD-10-CM

## 2023-08-30 DIAGNOSIS — I10 Essential (primary) hypertension: Secondary | ICD-10-CM | POA: Diagnosis not present

## 2023-08-30 DIAGNOSIS — R9431 Abnormal electrocardiogram [ECG] [EKG]: Secondary | ICD-10-CM

## 2023-08-30 HISTORY — DX: Bradycardia, unspecified: R00.1

## 2023-08-30 HISTORY — DX: Other fatigue: R53.83

## 2023-08-30 MED ORDER — AMLODIPINE BESYLATE 5 MG PO TABS: 5.0000 mg | ORAL_TABLET | Freq: Every day | ORAL | 3 refills | Status: DC

## 2023-08-30 MED ORDER — LISINOPRIL-HYDROCHLOROTHIAZIDE 20-25 MG PO TABS: 2.0000 | ORAL_TABLET | Freq: Every day | ORAL | 2 refills | Status: DC

## 2023-08-30 NOTE — Assessment & Plan Note (Signed)
 Tiredness with exertion.  However she does exercise at the gym couple times a week without any significant symptoms. Suspect this is due to uncontrolled blood pressures.  Medication changes as above for blood pressure control. If she is having persistent symptoms despite good control of blood pressure we will formally assess with treadmill stress test. Further evaluation for noncardiac causes of tiredness per PCP.

## 2023-08-30 NOTE — Progress Notes (Signed)
 Cardiology Consultation:    Date:  08/30/2023   ID:  Carolyn Morgan, DOB June 23, 1960, MRN [id number]  PCP:  Lorenda Ishihara, MD  Cardiologist:  Luretha Murphy, MD   Referring MD: Lorenda Ishihara,*   No chief complaint on file.    ASSESSMENT AND PLAN:   Ms. Khamis 63 year old with no significant prior cardiac history.  Has longstanding history of hypertension, hyperlipidemia, hepatic steatosis on ultrasound liver, presenting for symptoms of uncontrolled blood pressure, symptoms of effort intolerance recently.  With features of left ventricular hypertrophy on EKG and relatively slow heart rates.   Problem List Items Addressed This Visit     Abnormal EKG - Primary   Relevant Orders   EKG 12-Lead (Completed)   ECHOCARDIOGRAM COMPLETE   Essential hypertension   Uncontrolled today. Target below 130/80 mmHg. Continue lisinopril-hydrochlorothiazide titrate up the dose to 40 mg - 50 mg twice daily. Currently on atenolol 25 mg for over a year. Will not titrate up further given her slow heart rates. Will plan to discontinue atenolol in future if no significant underlying cardiac issues after workup.  For now continue with same dose.  For further optimization of blood pressure add amlodipine 5 mg once daily. Discussed the mechanism of action and potential side effects of lightheadedness with acute drop in blood pressure, pedal edema etc.  Will obtain a transthoracic echocardiogram tentatively to be scheduled at [facility] Center in [facility] which is closer to her home.      Relevant Medications   lisinopril-hydrochlorothiazide (ZESTORETIC) 20-25 MG tablet   amLODipine (NORVASC) 5 MG tablet   Other Relevant Orders   ECHOCARDIOGRAM COMPLETE   Sinus bradycardia   Relative slow heart rates on EKG today with heart rate in 50s.  Currently on atenolol 25 mg once daily. Heart rate variability on her Apple smart watch shows readings ranging from 50s to 110s. With  resting heart rate in 50s.  At this time we will continue her on current dose of atenolol, do not titrate up. Will plan to wean her off atenolol if there is no strong indication from cardiac standpoint after the echocardiogram.         Relevant Medications   lisinopril-hydrochlorothiazide (ZESTORETIC) 20-25 MG tablet   amLODipine (NORVASC) 5 MG tablet   Tiredness   Tiredness with exertion.  However she does exercise at the gym couple times a week without any significant symptoms. Suspect this is due to uncontrolled blood pressures.  Medication changes as above for blood pressure control. If she is having persistent symptoms despite good control of blood pressure we will formally assess with treadmill stress test. Further evaluation for noncardiac causes of tiredness per PCP.         Tentative follow-up in 1 month at [facility] health med [facility] office which is closer to her home.   History of Present Illness:    Carolyn Morgan is a 63 y.o. female who is being seen today for the evaluation of shortness of breath at the request of Lorenda Ishihara,*.  Very pleasant woman here for the visit by herself.  Lives at home with her 2 sons.  She works as a Warden/ranger at [employer].  Lives in [place].  She is originally from [place] and moved to [place] about 15 years ago.  History of hypertension, hyperlipidemia, hepatic steatosis on ultrasound of the liver,  Mentions work can get stressful.  She has been dealing with uncontrolled blood pressure readings at home for  the past few months ranging from systolic 150s to 644I.  More recently average blood pressures have been above 140s at home.  Mentions she works out at [employer], goes to [employer] couple times a week.  Has been able to exercise without any significant limitation.  More recently during 1 workout on treadmill and stationary bike she noticed her heart rates to be higher than usual and she had  to pause.  She did not report any chest pain or shortness of breath.  At the time is walking up an incline near her college she felt she was getting tired easily.  No chest pain or shortness of breath.  In the setting with uncontrolled blood pressures and above symptoms she presented for further evaluation.  EKG at PCPs office noted to be showing sinus rhythm with LVH features.  Denies any exertional symptoms day-to-day with activity.  Mentions headache is noticeable feature when her blood pressures are high at home.  She has been consistently taking her medications atenolol and lisinopril-hydrochlorothiazide combination.  Denies any palpitations, lightheadedness, dizziness or syncopal episodes. Denies any pedal edema. Denies any blood in urine or stools. Denies any significant weight gain or changes.  She does not smoke, drink alcohol or use recreational drugs.  Mentions family history of sister with MI requiring stents recently.   EKG in the clinic, shows sinus rhythm heart rate 54/min, PR interval normal 146 ms, QRS duration 80 ms with QRS morphology suggests LVH.  Lipid panel from 02-13-2023 total cholesterol 166, HDL 46, LDL 87, triglycerides 191.   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 Repeat in 5 years.     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   atenolol (TENORMIN) 25 MG tablet Take 1 tablet (25 mg total) by mouth daily.   atorvastatin (LIPITOR) 10 MG tablet Take 1 tablet (10 mg total) by mouth daily.   [DISCONTINUED] lisinopril-hydrochlorothiazide (ZESTORETIC) 20-25 MG tablet Take 1 tablet by mouth daily.     Allergies:   Patient has no known allergies.   Social History   Socioeconomic History   Marital status: Married    Spouse name: not legally separated   Number of children: 2   Years of education: Not on file   Highest education level: Not on file  Occupational History   Occupation: research associate    Comment: [employer]  Tobacco Use   Smoking status: Never   Smokeless tobacco: Never  Vaping Use   Vaping status: Never Used  Substance and Sexual Activity   Alcohol use: No    Alcohol/week: 0.0 standard drinks of alcohol   Drug use: No   Sexual activity: Not on file  Other Topics Concern   Not on file  Social History Narrative   From [place].   Lives with her sons.   Her husband lives in the [place].   Social Drivers of Corporate investment banker Strain: Not on file  Food Insecurity: Not on file  Transportation Needs: Not on file  Physical Activity: Not on file  Stress: Not on file  Social Connections: Not on file     Family History: The patient's family history includes Diabetes in her mother and sister; Hypertension in her sister. ROS:   Please see the history of present illness.    All 14 point review of systems negative except as described per history of present illness.  EKGs/Labs/Other Studies Reviewed:    The following studies were reviewed today:   EKG:  EKG Interpretation Date/Time:  Thursday August 30 2023 13:20:52 EDT Ventricular Rate:  54 PR Interval:  146 QRS Duration:  80 QT Interval:  396 QTC  Calculation: 375 R Axis:   89  Text Interpretation: Sinus bradycardia Moderate voltage criteria for LVH, may be normal variant Borderline ECG No previous ECGs available Confirmed by Huntley Dec reddy [phone]) on 08/30/2023 1:59:15 PM    Recent Labs: No results found for requested labs within last 365 days.  Recent Lipid Panel    Component Value Date/Time   CHOL 170 11/05/2015 1519   TRIG 259 (H) 11/05/2015 1519   HDL 49 11/05/2015 1519   CHOLHDL 3.5 11/05/2015 1519   VLDL 52 (H) 11/05/2015 1519   LDLCALC 69 11/05/2015 1519    Physical Exam:    VS:  BP (!) 152/96   Pulse (!) 54   Ht 5\' 5"  (1.651 m)   Wt 169 lb 6.4 oz (76.8 kg)   SpO2 99%   BMI 28.19 kg/m     Wt Readings from Last 3 Encounters:  08/30/23 169 lb 6.4 oz (76.8 kg)  10/25/22 162 lb (73.5 kg)  08/31/21 162 lb (73.5 kg)     GENERAL:  Well nourished, well developed in no acute distress NECK: No JVD; No carotid bruits CARDIAC: RRR, S1 and S2 present, no murmurs, no rubs, no gallops CHEST:  Clear to auscultation without rales, wheezing or rhonchi  Extremities: No pitting pedal edema. Pulses bilaterally symmetric with radial 2+ and dorsalis pedis 2+ NEUROLOGIC:  Alert and oriented x 3  Medication Adjustments/Labs and Tests Ordered: Current medicines are reviewed at length with the patient today.  Concerns regarding medicines are outlined above.  Orders Placed This Encounter  Procedures   EKG 12-Lead   ECHOCARDIOGRAM COMPLETE   Meds ordered this encounter  Medications   lisinopril-hydrochlorothiazide (ZESTORETIC) 20-25 MG tablet    Sig: Take 2 tablets by mouth daily.    Dispense:  180 tablet    Refill:  2   amLODipine (NORVASC) 5 MG tablet    Sig: Take 1 tablet (5 mg total) by mouth daily.    Dispense:  180 tablet    Refill:  3    Signed, Marleigh Kaylor reddy Alphonsa Brickle, MD, MPH, [facility]. 08/30/2023 2:29 PM    [facility]

## 2023-08-30 NOTE — Patient Instructions (Addendum)
 Medication Instructions:   Double your lisinopril-hydrochlorothiazide 20-25 mg once a day (take 2 tablets)   Start Amlodipine 5 mg once a day  *If you need a refill on your cardiac medications before your next appointment, please call your pharmacy*   Lab Work: None Ordered If you have labs (blood work) drawn today and your tests are completely normal, you will receive your results only by: MyChart Message (if you have MyChart) OR A paper copy in the mail If you have any lab test that is abnormal or we need to change your treatment, we will call you to review the results.   Testing/Procedures: Echocardiogram An echocardiogram is a test that uses sound waves (ultrasound) to produce images of the heart. Images from an echocardiogram can provide important information about: Heart size and shape. The size and thickness and movement of your heart's walls. Heart muscle function and strength. Heart valve function or if you have stenosis. Stenosis is when the heart valves are too narrow. If blood is flowing backward through the heart valves (regurgitation). A tumor or infectious growth around the heart valves. Areas of heart muscle that are not working well because of poor blood flow or injury from a heart attack. Aneurysm detection. An aneurysm is a weak or damaged part of an artery wall. The wall bulges out from the normal force of blood pumping through the body. Tell a health care provider about: Any allergies you have. All medicines you are taking, including vitamins, herbs, eye drops, creams, and over-the-counter medicines. Any blood disorders you have. Any surgeries you have had. Any medical conditions you have. Whether you are pregnant or may be pregnant. What are the risks? Generally, this is a safe test. However, problems may occur, including an allergic reaction to dye (contrast) that may be used during the test. What happens before the test? No specific preparation is needed.  You may eat and drink normally. What happens during the test?  You will take off your clothes from the waist up and put on a hospital gown. Electrodes or electrocardiogram (ECG)patches may be placed on your chest. The electrodes or patches are then connected to a device that monitors your heart rate and rhythm. You will lie down on a table for an ultrasound exam. A gel will be applied to your chest to help sound waves pass through your skin. A handheld device, called a transducer, will be pressed against your chest and moved over your heart. The transducer produces sound waves that travel to your heart and bounce back (or "echo" back) to the transducer. These sound waves will be captured in real-time and changed into images of your heart that can be viewed on a video monitor. The images will be recorded on a computer and reviewed by your health care provider. You may be asked to change positions or hold your breath for a short time. This makes it easier to get different views or better views of your heart. In some cases, you may receive contrast through an IV in one of your veins. This can improve the quality of the pictures from your heart. The procedure may vary among health care providers and hospitals. What can I expect after the test? You may return to your normal, everyday life, including diet, activities, and medicines, unless your health care provider tells you not to do that. Follow these instructions at home: It is up to you to get the results of your test. Ask your health care provider, or the  department that is doing the test, when your results will be ready. Keep all follow-up visits. This is important. Summary An echocardiogram is a test that uses sound waves (ultrasound) to produce images of the heart. Images from an echocardiogram can provide important information about the size and shape of your heart, heart muscle function, heart valve function, and other possible heart  problems. You do not need to do anything to prepare before this test. You may eat and drink normally. After the echocardiogram is completed, you may return to your normal, everyday life, unless your health care provider tells you not to do that. This information is not intended to replace advice given to you by your health care provider. Make sure you discuss any questions you have with your health care provider. Document Revised: 02/09/2021 Document Reviewed: 01/20/2020 Elsevier Patient Education  2023 Elsevier Inc.       Follow-Up: At [facility], you and your health needs are our priority.  As part of our continuing mission to provide you with exceptional heart care, we have created designated Provider Care Teams.  These Care Teams include your primary Cardiologist (physician) and Advanced Practice Providers (APPs -  Physician Assistants and Nurse Practitioners) who all work together to provide you with the care you need, when you need it.  We recommend signing up for the patient portal called "MyChart".  Sign up information is provided on this After Visit Summary.  MyChart is used to connect with patients for Virtual Visits (Telemedicine).  Patients are able to view lab/test results, encounter notes, upcoming appointments, etc.  Non-urgent messages can be sent to your provider as well.   To learn more about what you can do with MyChart, go to [email or web address].    Your next appointment:   1 month

## 2023-08-30 NOTE — Assessment & Plan Note (Signed)
 Relative slow heart rates on EKG today with heart rate in 50s.  Currently on atenolol 25 mg once daily. Heart rate variability on her Apple smart watch shows readings ranging from 50s to 110s. With resting heart rate in 50s.  At this time we will continue her on current dose of atenolol, do not titrate up. Will plan to wean her off atenolol if there is no strong indication from cardiac standpoint after the echocardiogram.

## 2023-08-30 NOTE — Assessment & Plan Note (Addendum)
 Uncontrolled today. Target below 130/80 mmHg. Continue lisinopril-hydrochlorothiazide titrate up the dose to 40 mg - 50 mg twice daily. Currently on atenolol 25 mg for over a year. Will not titrate up further given her slow heart rates. Will plan to discontinue atenolol in future if no significant underlying cardiac issues after workup.  For now continue with same dose.  For further optimization of blood pressure add amlodipine 5 mg once daily. Discussed the mechanism of action and potential side effects of lightheadedness with acute drop in blood pressure, pedal edema etc.  Will obtain a transthoracic echocardiogram tentatively to be scheduled at [facility] in [facility] which is closer to her home.

## 2023-08-31 ENCOUNTER — Telehealth: Payer: Self-pay

## 2023-08-31 NOTE — Telephone Encounter (Signed)
 Advised that Dr. Vincent Gros plans to discontinue the medication if her cardiac workup is negative. Pt agreed and had no additional questions.

## 2023-08-31 NOTE — Telephone Encounter (Signed)
 Pt c/o medication issue:  1. Name of Medication: Atenolol  2. How are you currently taking this medication (dosage and times per day)?   3. Are you having a reaction (difficulty breathing--STAT)?   4. What is your medication issue? Patient says this medicine make her feel like heart it is not beating properly- she wonder if she should cut her dose down?

## 2023-08-31 NOTE — Telephone Encounter (Signed)
 Left vm to return call.

## 2023-09-10 ENCOUNTER — Other Ambulatory Visit: Payer: Self-pay

## 2023-09-10 DIAGNOSIS — R001 Bradycardia, unspecified: Secondary | ICD-10-CM

## 2023-09-10 DIAGNOSIS — R5383 Other fatigue: Secondary | ICD-10-CM

## 2023-09-10 DIAGNOSIS — R9431 Abnormal electrocardiogram [ECG] [EKG]: Secondary | ICD-10-CM

## 2023-09-10 DIAGNOSIS — I10 Essential (primary) hypertension: Secondary | ICD-10-CM

## 2023-09-18 ENCOUNTER — Ambulatory Visit (HOSPITAL_BASED_OUTPATIENT_CLINIC_OR_DEPARTMENT_OTHER)
Admission: RE | Admit: 2023-09-18 | Discharge: 2023-09-18 | Disposition: A | Discharge status: Home / Self Care | Source: Ambulatory Visit

## 2023-09-18 DIAGNOSIS — R9431 Abnormal electrocardiogram [ECG] [EKG]: Secondary | ICD-10-CM | POA: Diagnosis not present

## 2023-09-18 DIAGNOSIS — R001 Bradycardia, unspecified: Secondary | ICD-10-CM | POA: Insufficient documentation

## 2023-09-19 LAB — ECHOCARDIOGRAM COMPLETE
AR max vel: 1.57 cm2
AV Area VTI: 1.58 cm2
AV Area mean vel: 1.59 cm2
AV Mean grad: 7 mmHg
AV Peak grad: 12.4 mmHg
Ao pk vel: 1.76 m/s
Area-P 1/2: 3.77 cm2
Calc EF: 80.2 %
MV M vel: 4.63 m/s
MV Peak grad: 85.7 mmHg
S' Lateral: 1.5 cm
Single Plane A2C EF: 82.2 %
Single Plane A4C EF: 77.8 %

## 2023-10-02 ENCOUNTER — Telehealth: Payer: Self-pay

## 2023-10-02 ENCOUNTER — Ambulatory Visit

## 2023-10-02 VITALS — BP 122/78 | HR 62 | Ht 65.0 in | Wt 164.8 lb

## 2023-10-02 DIAGNOSIS — I10 Essential (primary) hypertension: Secondary | ICD-10-CM

## 2023-10-02 DIAGNOSIS — E66811 Obesity, class 1: Secondary | ICD-10-CM | POA: Insufficient documentation

## 2023-10-02 DIAGNOSIS — I340 Nonrheumatic mitral (valve) insufficiency: Secondary | ICD-10-CM

## 2023-10-02 DIAGNOSIS — R001 Bradycardia, unspecified: Secondary | ICD-10-CM | POA: Diagnosis not present

## 2023-10-02 HISTORY — DX: Nonrheumatic mitral (valve) insufficiency: I34.0

## 2023-10-02 HISTORY — DX: Obesity, class 1: E66.811

## 2023-10-02 MED ORDER — LISINOPRIL-HYDROCHLOROTHIAZIDE 20-25 MG PO TABS: 2.0000 | ORAL_TABLET | Freq: Every day | ORAL | 2 refills | Status: DC

## 2023-10-02 MED ORDER — ATENOLOL 25 MG PO TABS: 25.0000 mg | ORAL_TABLET | Freq: Every day | ORAL | 2 refills | Status: DC

## 2023-10-02 MED ORDER — AMLODIPINE BESYLATE 5 MG PO TABS: 5.0000 mg | ORAL_TABLET | Freq: Every day | ORAL | 3 refills | Status: DC

## 2023-10-02 NOTE — Telephone Encounter (Signed)
 Yes, it is fine to take at the same time

## 2023-10-02 NOTE — Telephone Encounter (Signed)
 Left vm to return call and MyChart message sent

## 2023-10-02 NOTE — Assessment & Plan Note (Signed)
 Improved blood pressure control. Target below 130/80 mmHg.  Now optimal. Continue with current medications lisinopril -hydrochlorothiazide  20 mg - 25 mg takes 2 tablets a day Continue amlodipine  5 mg once daily Continue atenolol  25 mg once daily.

## 2023-10-02 NOTE — Progress Notes (Signed)
 Cardiology Consultation:    Date:  10/02/2023   ID:  Carolyn Morgan, DOB 1960/08/20, MRN [id number]  PCP:  Arva Lathe, MD  Cardiologist:  Angelena Kells, MD   Referring MD: Arva Lathe,*   No chief complaint on file.    ASSESSMENT AND PLAN:   Carolyn Morgan 63 year old woman with history of hypertension, hyperlipidemia, hepatic steatosis, EKG suggestive of left ventricular hypertrophy, mild mitral regurgitation, doing well from [place] and good blood pressure control since last visit.  Problem List Items Addressed This Visit     Essential hypertension - Primary   Improved blood pressure control. Target below 130/80 mmHg.  Now optimal. Continue with current medications lisinopril -hydrochlorothiazide  20 mg - 25 mg takes 2 tablets a day Continue amlodipine  5 mg once daily Continue atenolol  25 mg once daily.       Relevant Medications   amLODipine  (NORVASC ) 5 MG tablet   lisinopril -hydrochlorothiazide  (ZESTORETIC ) 20-25 MG tablet   atenolol  (TENORMIN ) 25 MG tablet   Sinus bradycardia   Asymptomatic. Heart rates in 60s today.  Continue with current dose of atenolol  given her blood pressure optimization current regimen.       Relevant Medications   amLODipine  (NORVASC ) 5 MG tablet   lisinopril -hydrochlorothiazide  (ZESTORETIC ) 20-25 MG tablet   atenolol  (TENORMIN ) 25 MG tablet   Mild mitral regurgitation   Noted on echocardiogram April 2025. Reviewed the findings with her. Will anticipate repeat echocardiogram tentatively in 2 years.       Relevant Medications   amLODipine  (NORVASC ) 5 MG tablet   lisinopril -hydrochlorothiazide  (ZESTORETIC ) 20-25 MG tablet   atenolol  (TENORMIN ) 25 MG tablet  Return to clinic tentatively in 1 year.    History of Present Illness:    Carolyn Morgan is a 63 y.o. female who is being seen today for follow-up visit. PCP is Arva Lathe,*. Last visit with me in the office was 08-30-2023.  Pleasant woman  here for the visit by herself. Has history of hypertension, hyperlipidemia, hepatic steatosis, EKG findings suggestive of left ventricular hypertrophy seen for uncontrolled blood pressures at last visit.  Mentions overall she has been doing well.  Blood pressures have been well-controlled.  Home log of blood pressure readings reviewed on her phone. She mentions she has been tolerating her current medications for blood pressure amlodipine , lisinopril -hydrochlorothiazide , atenolol  25 mg once daily without any significant side effects.  She had been reading up on side effects of amlodipine  potentially and was concerned if her heart rates are elevated.  She denies any other cardiac symptoms such as chest pain, shortness of breath, orthopnea, paroxysmal nocturnal dyspnea.  No pedal edema.  Echocardiogram from 09-18-2023 reviewed showed normal biventricular function with mild concentric left ventricular hypertrophy, normal diastolic parameters with LVEF 65 to 70%.  Mild mitral regurgitation noted.  No other significant valve abnormalities  Social history: Lives with her 2 sons.  Works as a Warden/ranger at [employer].  Moved from [place] to [place]  about 15 years ago.  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 Repeat in 5 years.     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   [DISCONTINUED] amLODipine  (NORVASC ) 5 MG tablet Take 1 tablet (5 mg total) by mouth daily.   [DISCONTINUED] atenolol  (TENORMIN ) 25 MG tablet Take 1 tablet (25 mg total) by mouth daily.   [DISCONTINUED] lisinopril -hydrochlorothiazide  (ZESTORETIC ) 20-25 MG tablet Take 2 tablets by mouth daily.     Allergies:   Patient has no known allergies.   Social History   Socioeconomic History   Marital status: Married    Spouse name: not legally separated   Number of children: 2   Years of education: Not on file   Highest education level: Not on file  Occupational History   Occupation: research associate    Comment: [employer]  Tobacco Use   Smoking status: Never   Smokeless tobacco: Never  Vaping Use   Vaping status: Never Used  Substance and Sexual Activity   Alcohol use: No    Alcohol/week: 0.0 standard drinks of alcohol   Drug use: No   Sexual activity: Not on file  Other Topics Concern   Not on file  Social History Narrative   From [place].   Lives with her sons.   Her husband lives in the [place].   Social Drivers of Corporate investment banker Strain: Not on file  Food Insecurity: Not on file  Transportation Needs: Not on file  Physical Activity: Not on file  Stress: Not on file  Social Connections: Not on file     Family History: The patient's family history includes Diabetes in her mother and sister; Hypertension in her sister. ROS:   Please see the history of present illness.    All 14 point review of systems negative except as described per history of present illness.  EKGs/Labs/Other Studies Reviewed:    The following studies were reviewed today:   EKG:       Recent Labs: No results found for requested labs within  last 365 days.  Recent Lipid Panel    Component Value Date/Time   CHOL 170 11/05/2015 1519   TRIG 259 (H) 11/05/2015 1519   HDL 49 11/05/2015 1519   CHOLHDL 3.5 11/05/2015 1519   VLDL 52 (H) 11/05/2015 1519   LDLCALC 69 11/05/2015 1519    Physical Exam:    VS:  BP 122/78   Pulse 62   Ht 5\' 5"  (1.651 m)   Wt 164 lb 12.8 oz (74.8 kg)   SpO2 98%   BMI 27.42 kg/m     Wt Readings from Last 3 Encounters:  10/02/23 164 lb 12.8 oz (74.8 kg)  08/30/23 169 lb 6.4 oz (76.8 kg)  10/25/22 162 lb (73.5 kg)     GENERAL:  Well nourished, well developed in no acute distress NECK: No JVD; No carotid bruits CARDIAC: RRR, S1 and S2 present, no murmurs, no rubs, no gallops CHEST:  Clear to auscultation without rales, wheezing or rhonchi  Extremities: No pitting pedal edema. Pulses bilaterally symmetric with radial 2+ and dorsalis pedis 2+ NEUROLOGIC:  Alert and oriented x 3  Medication Adjustments/Labs and  Tests Ordered: Current medicines are reviewed at length with the patient today.  Concerns regarding medicines are outlined above.  No orders of the defined types were placed in this encounter.  Meds ordered this encounter  Medications   amLODipine  (NORVASC ) 5 MG tablet    Sig: Take 1 tablet (5 mg total) by mouth daily.    Dispense:  90 tablet    Refill:  3   lisinopril -hydrochlorothiazide  (ZESTORETIC ) 20-25 MG tablet    Sig: Take 2 tablets by mouth daily.    Dispense:  180 tablet    Refill:  2   atenolol  (TENORMIN ) 25 MG tablet    Sig: Take 1 tablet (25 mg total) by mouth daily.    Dispense:  90 tablet    Refill:  2    Signed, Aika Brzoska reddy Carrine Kroboth, MD, MPH, [facility]. 10/02/2023 10:14 AM    [facility]

## 2023-10-02 NOTE — Assessment & Plan Note (Signed)
 Noted on echocardiogram April 2025. Reviewed the findings with her. Will anticipate repeat echocardiogram tentatively in 2 years.

## 2023-10-02 NOTE — Assessment & Plan Note (Signed)
 Asymptomatic. Heart rates in 60s today.  Continue with current dose of atenolol  given her blood pressure optimization current regimen.

## 2023-10-02 NOTE — Patient Instructions (Signed)
 Medication Instructions:  Your physician recommends that you continue on your current medications as directed. Please refer to the Current Medication list given to you today.  *If you need a refill on your cardiac medications before your next appointment, please call your pharmacy*   Lab Work: None Ordered If you have labs (blood work) drawn today and your tests are completely normal, you will receive your results only by: MyChart Message (if you have MyChart) OR A paper copy in the mail If you have any lab test that is abnormal or we need to change your treatment, we will call you to review the results.   Testing/Procedures: None Ordered   Follow-Up: At [facility], you and your health needs are our priority.  As part of our continuing mission to provide you with exceptional heart care, we have created designated Provider Care Teams.  These Care Teams include your primary Cardiologist (physician) and Advanced Practice Providers (APPs -  Physician Assistants and Nurse Practitioners) who all work together to provide you with the care you need, when you need it.  We recommend signing up for the patient portal called "MyChart".  Sign up information is provided on this After Visit Summary.  MyChart is used to connect with patients for Virtual Visits (Telemedicine).  Patients are able to view lab/test results, encounter notes, upcoming appointments, etc.  Non-urgent messages can be sent to your provider as well.   To learn more about what you can do with MyChart, go to [email or web address].    Your next appointment: 1 year follow up

## 2023-10-02 NOTE — Telephone Encounter (Signed)
 Pt c/o medication issue:  1. Name of Medication:   amLODipine  (NORVASC ) 5 MG tablet    lisinopril -hydrochlorothiazide  (ZESTORETIC ) 20-25 MG tablet    2. How are you currently taking this medication (dosage and times per day)? As written  3. Are you having a reaction (difficulty breathing--STAT)? No   4. What is your medication issue? Pt is asking if she can take the two medications at the same time. Currently she is taking the Lisinopril  at 7:30 am and Amlodipine  at 11:00 am. She wants to make sure it is ok to take at same time

## 2023-10-22 ENCOUNTER — Other Ambulatory Visit: Payer: Self-pay | Admitting: Internal Medicine

## 2023-10-22 DIAGNOSIS — Z1231 Encounter for screening mammogram for malignant neoplasm of breast: Secondary | ICD-10-CM

## 2023-12-11 ENCOUNTER — Ambulatory Visit
Admission: RE | Admit: 2023-12-11 | Discharge: 2023-12-11 | Disposition: A | Discharge status: Home / Self Care | Source: Ambulatory Visit | Attending: Internal Medicine | Admitting: Internal Medicine

## 2023-12-11 DIAGNOSIS — Z1231 Encounter for screening mammogram for malignant neoplasm of breast: Secondary | ICD-10-CM

## 2023-12-18 ENCOUNTER — Ambulatory Visit

## 2023-12-18 VITALS — BP 138/80 | HR 78 | Ht 65.0 in | Wt 163.0 lb

## 2023-12-18 DIAGNOSIS — I10 Essential (primary) hypertension: Secondary | ICD-10-CM

## 2023-12-18 MED ORDER — SPIRONOLACTONE 25 MG PO TABS: 25.0000 mg | ORAL_TABLET | Freq: Every day | ORAL | 3 refills | Status: AC

## 2023-12-18 MED ORDER — LISINOPRIL-HYDROCHLOROTHIAZIDE 20-25 MG PO TABS: 2.0000 | ORAL_TABLET | Freq: Every day | ORAL | 3 refills | Status: AC

## 2023-12-18 NOTE — Progress Notes (Signed)
 Cardiology Consultation:    Date:  12/18/2023   ID:  Carolyn Morgan, DOB [birth year], MRN [id number]  PCP:  Elliot Charm, MD  Cardiologist:  Alean JONELLE Kobus, MD   Referring MD: Elliot Charm,*   No chief complaint on file.    ASSESSMENT AND PLAN:   Ms. Iezzi 63 year old woman history of hypertension, hyperlipidemia, hepatic steatosis, LVH, mild MR on echocardiogram April 2025, asymptomatic sinus bradycardia attributed to ongoing antihypertensive treatment with atenolol  and was subsequently discontinued due to feeling poorly while on atenolol . Amlodipine  started for blood pressure optimization caused hyperpigmentation of the skin and hence discontinued.   Problem List Items Addressed This Visit     Essential hypertension - Primary   Continue with current dose of hydrochlorothiazide -lisinopril  50 mg - 40 mg once daily. Further optimize blood pressure control target below 130/80 mmHg. Discussed various options. Unable to take amlodipine  and atenolol  for reasons reviewed above. Will start spironolactone  25 mg once daily, discussed mechanism of action and potential side effects. Will obtain basic metabolic panel tentatively in 2 weeks to check up on electrolytes specifically potassium and renal function.      Return to clinic tentatively in 3 months.   History of Present Illness:    Carolyn Morgan is a 63 y.o. female who is being seen today for full visit. PCP is Elliot Charm,*. Last visit with me in the office was 10/02/2023.  Has history of hypertension, hyperlipidemia, hepatic steatosis, LVH, mild MR on echocardiogram April 2025, asymptomatic sinus bradycardia attributed to ongoing antihypertensive treatment with atenolol  and was subsequently discontinued due to feeling poorly while on atenolol . Amlodipine  started for blood pressure optimization caused hyperpigmentation of the skin and hence discontinued.  Here for follow-up visit  today. Mentions had dental procedure done this morning. Blood pressures at home log reviewed shows numbers ranging from 120s to 140s.  No other cardiac symptoms at this time.    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 Repeat in 5 years.     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   lisinopril -hydrochlorothiazide  (ZESTORETIC ) 20-25 MG tablet Take 2 tablets by mouth daily.     Allergies:   Patient has no known allergies.   Social History   Socioeconomic History   Marital status: Married    Spouse name: not legally separated   Number of children: 2   Years of education: Not on file   Highest education level: Not on file  Occupational History   Occupation: research associate    Comment: [employer]  Tobacco Use   Smoking status: Never   Smokeless tobacco: Never  Vaping Use   Vaping  status: Never Used  Substance and Sexual Activity   Alcohol use: No    Alcohol/week: 0.0 standard drinks of alcohol   Drug use: No   Sexual activity: Not on file  Other Topics Concern   Not on file  Social History Narrative   From [place].    Lives with her sons.   Her husband lives in the [place].   Social Drivers of Corporate investment banker Strain: Not on file  Food Insecurity: Not on file  Transportation Needs: Not on file  Physical Activity: Not on file  Stress: Not on file  Social Connections: Not on file     Family History: The patient's family history includes Diabetes in her mother and sister; Hypertension in her sister. ROS:   Please see the history of present illness.    All 14 point review of systems negative except as described per history of present illness.  EKGs/Labs/Other Studies Reviewed:    The following studies were reviewed today:   EKG:       Recent Labs: No results found for requested labs within last 365 days.  Recent Lipid Panel    Component Value Date/Time   CHOL 170 11/05/2015 1519   TRIG 259 (H) 11/05/2015 1519   HDL 49 11/05/2015 1519   CHOLHDL 3.5 11/05/2015 1519   VLDL 52 (H) 11/05/2015 1519   LDLCALC 69 11/05/2015 1519    Physical Exam:    VS:  BP 138/80   Pulse 78   Ht 5' 5 (1.651 m)   Wt 163 lb (73.9 kg)   SpO2 98%   BMI 27.12 kg/m     Wt Readings from Last 3 Encounters:  12/18/23 163 lb (73.9 kg)  10/02/23 164 lb 12.8 oz (74.8 kg)  08/30/23 169 lb 6.4 oz (76.8 kg)     GENERAL:  Well nourished, well developed in no acute distress CARDIAC: RRR, S1 and S2 present, no murmurs, no rubs, no gallops  Extremities: No pitting pedal edema. Pulses bilaterally symmetric with radial 2+ and dorsalis pedis 2+ NEUROLOGIC:  Alert and oriented x 3  Medication Adjustments/Labs and Tests Ordered: Current medicines are reviewed at length with the patient today.  Concerns regarding medicines are outlined above.  No orders of the defined types were placed in this encounter.  No orders of the defined types were placed in this encounter.   Signed, Alean jess Kobus, MD, MPH, [facility]. 12/18/2023 1:26 PM    [facility]

## 2023-12-18 NOTE — Patient Instructions (Signed)
 Medication Instructions:  Your physician has recommended you make the following change in your medication:   START: Aldactone  25 mg daily   *If you need a refill on your cardiac medications before your next appointment, please call your pharmacy*  Lab Work: Your physician recommends that you return for lab work in:   Labs in 2 weeks: BMP  If you have labs (blood work) drawn today and your tests are completely normal, you will receive your results only by: MyChart Message (if you have MyChart) OR A paper copy in the mail If you have any lab test that is abnormal or we need to change your treatment, we will call you to review the results.  Testing/Procedures: None  Follow-Up: At [facility], you and your health needs are our priority.  As part of our continuing mission to provide you with exceptional heart care, our providers are all part of one team.  This team includes your primary Cardiologist (physician) and Advanced Practice Providers or APPs (Physician Assistants and Nurse Practitioners) who all work together to provide you with the care you need, when you need it.  Your next appointment:   3 month(s)  Provider:   Alean Kobus, MD    We recommend signing up for the patient portal called MyChart.  Sign up information is provided on this After Visit Summary.  MyChart is used to connect with patients for Virtual Visits (Telemedicine).  Patients are able to view lab/test results, encounter notes, upcoming appointments, etc.  Non-urgent messages can be sent to your provider as well.   To learn more about what you can do with MyChart, go to [email or web address].   Other Instructions None

## 2023-12-18 NOTE — Assessment & Plan Note (Signed)
 Continue with current dose of hydrochlorothiazide -lisinopril  50 mg - 40 mg once daily. Further optimize blood pressure control target below 130/80 mmHg. Discussed various options. Unable to take amlodipine  and atenolol  for reasons reviewed above. Will start spironolactone  25 mg once daily, discussed mechanism of action and potential side effects. Will obtain basic metabolic panel tentatively in 2 weeks to check up on electrolytes specifically potassium and renal function.

## 2024-01-01 ENCOUNTER — Telehealth: Payer: Self-pay

## 2024-01-01 NOTE — Telephone Encounter (Signed)
 Left message for the patient to call back.

## 2024-01-01 NOTE — Telephone Encounter (Signed)
 Patient returned the call and stated that she wanted to schedule an appointment to see Dr. Liborio, since she had went to the ER because she was having chest pain. The ER doctor had recommended that she follow up with her cardiologist. An appointment was scheduled for her to see Dr. Liborio on 01/04/24 at 2:20 pm. Patient verbalized understanding and had no further questions at this time.

## 2024-01-01 NOTE — Telephone Encounter (Signed)
 Patient calling to see speak with the nurse about her ER visit that she had recently. Please advise ?

## 2024-01-04 ENCOUNTER — Ambulatory Visit

## 2024-01-04 VITALS — BP 138/82 | HR 76 | Ht 65.0 in | Wt 164.6 lb

## 2024-01-04 DIAGNOSIS — I10 Essential (primary) hypertension: Secondary | ICD-10-CM

## 2024-01-04 DIAGNOSIS — R072 Precordial pain: Secondary | ICD-10-CM | POA: Diagnosis not present

## 2024-01-04 DIAGNOSIS — R0789 Other chest pain: Secondary | ICD-10-CM | POA: Diagnosis not present

## 2024-01-04 MED ORDER — METOPROLOL TARTRATE 100 MG PO TABS: 100.0000 mg | ORAL_TABLET | Freq: Once | ORAL | 0 refills | Status: DC

## 2024-01-04 NOTE — Progress Notes (Signed)
 Cardiology Consultation:    Date:  01/04/2024   ID:  Carolyn Morgan, DOB February 27, 1961, MRN [id number]  PCP:  Carolyn Charm, MD  Cardiologist:  Carolyn JONELLE Kobus, MD   Referring MD: Carolyn Morgan,*   No chief complaint on file.    ASSESSMENT AND PLAN:   [name] 63 year old woman history of hypertension, hyperlipidemia, hepatic steatosis, LVH, mild MR on echocardiogram April 2025, asymptomatic sinus bradycardia attributed to ongoing antihypertensive treatment with atenolol  and was subsequently discontinued.  Amlodipine  was poorly tolerated due to hyperpigmentation.  Has been doing well on spironolactone .  Last week had a visit to the ER for acute chest pain and back pain it has gradually subsided but has been occurring on and off. She is extremely concerned about the symptoms and is here for further evaluation Problem List Items Addressed This Visit     Essential hypertension   Improved blood pressure control. Continue lisinopril -hydrochlorothiazide  40 mg - 50 mg once daily. Continue spironolactone  25 mg once daily. Blood work from 12/28/2023 reviewed notes potassium 3.5 and creatinine 0.76. Continue with spironolactone .       Atypical chest pain - Primary   Acute ongoing symptoms of atypical chest pain and back pain.  Negative troponins in the ER. EKG in the ER with no significant changes from baseline.  She is extremely concerned about the symptoms and has been limiting her activity. Differential diagnosis is extensive including cardiac causes primary underlying obstructive coronary artery disease but more likely appears to be a musculoskeletal issue.  Cannot exclude costochondritis.  She is extremely concerned and wants to rule out any significant cardiac or noncardiac causes.  From cardiac standpoint not a candidate for a treadmill stress test at this time with active ongoing symptoms. Has repolarization changes pertaining to her baseline LVH pattern on  EKG.  Will proceed with cardiac CT coronary angiogram to rule out any significant obstructive coronary artery disease. Advised her to follow-up with PCP to get evaluated for any other noncardiac causes. Reassured her about the workup thus far done in the ER.         Return to office for follow-up based on test results or in 4 months.  History of Present Illness:    Carolyn Morgan is a 63 y.o. female who is being seen today for follow-up visit. PCP is Carolyn Morgan,*. Last visit with me in the office was 12/18/2023.  Pleasant woman lives at home with her 2 sons.  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  history of hypertension, hyperlipidemia, hepatic steatosis, LVH, mild MR on echocardiogram April 2025, asymptomatic sinus bradycardia attributed to ongoing antihypertensive treatment with atenolol  and was subsequently discontinued.  Amlodipine  was poorly tolerated due to hyperpigmentation.  Recently 12/28/2023 she had a visit to the ER for symptoms of back pain in the left upper back.  Worse with deep breath workup in the ER noted hemodynamically stable with blood pressure 159/73 mmHg, blood work with high-sensitivity troponin 10 and subsequently trended down to 5, 6, no acute chest x-ray findings or C-spine findings EKG with nonspecific T wave changes normal sinus rhythm.  No CT imaging was pursued.   She is extremely stressed out about her symptoms and was wondering what is causing the symptoms. Describes the pain happened suddenly in the upper back and worse with deep breath or any kind of movements.  This gradually subsided over the next couple days.  She even went to the gym 1 day since this episode and was able  to do weights and exercise but continues to have discomfort.  She had her son over the phone today and they were both very concerned about her symptoms.  They were wondering if this has anything else to do with pulmonary or any other internal organ  issues.  Hemodynamically she looks okay today.  Denies any chest pain or shortness of breath. Denies any worsening episodes with deep breath today. No palpitations.  She has been taking medications consistently and feels spironolactone  has been well-tolerated.  Prior EKG from 08/30/2023 noted sinus rhythm with heart rate 54/min, LVH criteria with nonspecific lateral precordial lead ST-T changes.  Blood work from 12/28/2023 noted BUN 16, creatinine 0.76 Potassium 3.5, sodium 136. 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 Repeat in 5 years.     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   lisinopril -hydrochlorothiazide  (ZESTORETIC ) 20-25 MG tablet Take 2 tablets by mouth daily.   spironolactone  (ALDACTONE ) 25 MG tablet Take 1 tablet (25 mg total) by mouth daily.     Allergies:   Patient has no known allergies.   Social History   Socioeconomic History   Marital status: Married    Spouse name: not legally separated   Number of children: 2   Years of education: Not on file   Highest education level: Not on file   Occupational History   Occupation: research associate    Comment: [employer]  Tobacco Use   Smoking status: Never   Smokeless tobacco: Never  Vaping Use   Vaping status: Never Used  Substance and Sexual Activity   Alcohol use: No    Alcohol/week: 0.0 standard drinks of alcohol   Drug use: No   Sexual activity: Not on file  Other Topics Concern   Not on file  Social History Narrative   From [place].   Lives with her sons.   Her husband lives in the [place].   Social Drivers of Corporate investment banker Strain: Not on file  Food Insecurity: Not on file  Transportation Needs: Not on file  Physical Activity: Not on file  Stress: Not on file  Social Connections: Not on file     Family History: The patient's family history includes Diabetes in her mother and sister; Hypertension in her sister. ROS:   Please see the history of present illness.    All 14 point review of systems negative except as described per history of present illness.  EKGs/Labs/Other Studies Reviewed:    The following studies were reviewed today:   EKG:       Recent Labs: No results found for requested labs within last  365 days.  Recent Lipid Panel    Component Value Date/Time   CHOL 170 11/05/2015 1519   TRIG 259 (H) 11/05/2015 1519   HDL 49 11/05/2015 1519   CHOLHDL 3.5 11/05/2015 1519   VLDL 52 (H) 11/05/2015 1519   LDLCALC 69 11/05/2015 1519    Physical Exam:    VS:  BP 138/82   Pulse 76   Ht 5' 5 (1.651 m)   Wt 164 lb 9.6 oz (74.7 kg)   SpO2 98%   BMI 27.39 kg/m     Wt Readings from Last 3 Encounters:  01/04/24 164 lb 9.6 oz (74.7 kg)  12/18/23 163 lb (73.9 kg)  10/02/23 164 lb 12.8 oz (74.8 kg)     GENERAL:  Well nourished, well developed in no acute distress NECK: No JVD; No carotid bruits CARDIAC: RRR, S1 and S2 present, no murmurs, no rubs, no gallops CHEST:  Clear to auscultation without rales, wheezing or rhonchi  Extremities: No pitting pedal edema. Pulses  bilaterally symmetric with radial 2+ and dorsalis pedis 2+ NEUROLOGIC:  Alert and oriented x 3  Medication Adjustments/Labs and Tests Ordered: Current medicines are reviewed at length with the patient today.  Concerns regarding medicines are outlined above.  No orders of the defined types were placed in this encounter.  No orders of the defined types were placed in this encounter.   Signed, Carolyn jess Kobus, MD, MPH, [facility]. 01/04/2024 3:10 PM    [facility]

## 2024-01-04 NOTE — Assessment & Plan Note (Signed)
 Improved blood pressure control. Continue lisinopril -hydrochlorothiazide  40 mg - 50 mg once daily. Continue spironolactone  25 mg once daily. Blood work from 12/28/2023 reviewed notes potassium 3.5 and creatinine 0.76. Continue with spironolactone .

## 2024-01-04 NOTE — Patient Instructions (Signed)
 Medication Instructions:  Your physician recommends that you continue on your current medications as directed. Please refer to the Current Medication list given to you today.  *If you need a refill on your cardiac medications before your next appointment, please call your pharmacy*  Lab Work: NONE If you have labs (blood work) drawn today and your tests are completely normal, you will receive your results only by: MyChart Message (if you have MyChart) OR A paper copy in the mail If you have any lab test that is abnormal or we need to change your treatment, we will call you to review the results.  Testing/Procedures:   Your cardiac CT will be scheduled at one of the below locations:   [facility] [address] [place], [place] [address] [phone]    Please follow these instructions carefully (unless otherwise directed):  An IV will be required for this test and Nitroglycerin will be given.  Hold all erectile dysfunction medications at least 3 days (72 hrs) prior to test. (Ie viagra, cialis, sildenafil, tadalafil, etc)   On the Night Before the Test: Be sure to Drink plenty of water. Do not consume any caffeinated/decaffeinated beverages or chocolate 12 hours prior to your test. Do not take any antihistamines 12 hours prior to your test.  On the Day of the Test: Drink plenty of water until 1 hour prior to the test. Do not eat any food 1 hour prior to test. You may take your regular medications prior to the test.  Take metoprolol (Lopressor) two hours prior to test. If you take Furosemide/Hydrochlorothiazide /Spironolactone /Chlorthalidone, please HOLD on the morning of the test. Patients who wear a continuous glucose monitor MUST remove the device prior to scanning. FEMALES- please wear underwire-free bra if available, avoid dresses & tight clothing  After the Test: Drink plenty of water. After receiving IV contrast, you may experience a mild flushed  feeling. This is normal. On occasion, you may experience a mild rash up to 24 hours after the test. This is not dangerous. If this occurs, you can take Benadryl 25 mg and increase your fluid intake. If you experience trouble breathing, this can be serious. If it is severe call 911 IMMEDIATELY. If it is mild, please call our office.  We will call to schedule your test 2-4 weeks out understanding that some insurance companies will need an authorization prior to the service being performed.   For more information and frequently asked questions, please visit our website : [email or web address]  For non-scheduling related questions, please contact the cardiac imaging nurse navigator should you have any questions/concerns: Cardiac Imaging Nurse Navigators Direct Office Dial: [phone]   For scheduling needs, including cancellations and rescheduling, please call [place], [phone].   Follow-Up: At [facility], you and your health needs are our priority.  As part of our continuing mission to provide you with exceptional heart care, our providers are all part of one team.  This team includes your primary Cardiologist (physician) and Advanced Practice Providers or APPs (Physician Assistants and Nurse Practitioners) who all work together to provide you with the care you need, when you need it.  Your next appointment:   4 month(s)  Provider:   Alean Kobus, MD    We recommend signing up for the patient portal called MyChart.  Sign up information is provided on this After Visit Summary.  MyChart is used to connect with patients for Virtual Visits (Telemedicine).  Patients are able to view lab/test results, encounter notes,  upcoming appointments, etc.  Non-urgent messages can be sent to your provider as well.   To learn more about what you can do with MyChart, go to [email or web address].   Other Instructions

## 2024-01-04 NOTE — Assessment & Plan Note (Signed)
 Acute ongoing symptoms of atypical chest pain and back pain.  Negative troponins in the ER. EKG in the ER with no significant changes from baseline.  She is extremely concerned about the symptoms and has been limiting her activity. Differential diagnosis is extensive including cardiac causes primary underlying obstructive coronary artery disease but more likely appears to be a musculoskeletal issue.  Cannot exclude costochondritis.  She is extremely concerned and wants to rule out any significant cardiac or noncardiac causes.  From cardiac standpoint not a candidate for a treadmill stress test at this time with active ongoing symptoms. Has repolarization changes pertaining to her baseline LVH pattern on EKG.  Will proceed with cardiac CT coronary angiogram to rule out any significant obstructive coronary artery disease. Advised her to follow-up with PCP to get evaluated for any other noncardiac causes. Reassured her about the workup thus far done in the ER.

## 2024-01-07 ENCOUNTER — Ambulatory Visit
Admission: EM | Admit: 2024-01-07 | Discharge: 2024-01-07 | Disposition: A | Discharge status: Home / Self Care | Attending: Family Medicine | Admitting: Family Medicine

## 2024-01-07 ENCOUNTER — Ambulatory Visit: Payer: Self-pay

## 2024-01-07 ENCOUNTER — Other Ambulatory Visit: Payer: Self-pay

## 2024-01-07 DIAGNOSIS — M546 Pain in thoracic spine: Secondary | ICD-10-CM

## 2024-01-07 MED ORDER — CYCLOBENZAPRINE HCL 10 MG PO TABS: 10.0000 mg | ORAL_TABLET | Freq: Every day | ORAL | 0 refills | Status: DC

## 2024-01-07 MED ORDER — NAPROXEN SODIUM 550 MG PO TABS: 550.0000 mg | ORAL_TABLET | Freq: Two times a day (BID) | ORAL | 0 refills | Status: DC

## 2024-01-07 NOTE — ED Triage Notes (Signed)
 Upper back pain for 2 months. Reports acute exacerbation 9-10 days ago. No otc meds.

## 2024-01-07 NOTE — Telephone Encounter (Addendum)
 FYI Only or Action Required?: FYI only for provider.  Patient was last seen in primary care on n/a .  Called Nurse Triage reporting Back Pain.  Symptoms began last Friday (1 week ago).  Interventions attempted: OTC medications: NSAIDS.  Symptoms are: unchanged.  Triage Disposition: See PCP When Office is Open (Within 3 Days)  Patient/caregiver understands and will follow disposition?: Yes   Pt called wanting new PCP. Pt wanting NP appt asap about current issue. Advised pt that happy to make appt but a NP appt is njot for acute issues. Advised pt to call current PCP or go to UC for current issue.  Found PCP who is an MD with an opening today and pt refused. Looked at [facility] and pt refused appts with MD only (per pt request). No PA or NP.            Copied from CRM #[id number]. Topic: Clinical - Red Word Triage >> Jan 07, 2024 12:59 PM Carolyn Morgan wrote: Red Word that prompted transfer to Nurse Triage: Pain in upper back and when taking deep breathe she can feel pain as well Answer Assessment - Initial Assessment Questions 1. ONSET: When did the pain begin? (e.g., minutes, hours, days)     Friday (week ago) 2. LOCATION: Where does it hurt? (upper, mid or lower back)     upper 3. SEVERITY: How bad is the pain?  (e.g., Scale 1-10; mild, moderate, or severe)     Mild 3/10 4. PATTERN: Is the pain constant? (e.g., yes, no; constant, intermittent)      Constant but varies in intensity 5. RADIATION: Does the pain shoot into your legs or somewhere else?     no 6. CAUSE:  What do you think is causing the back pain?      Unsure  8. MEDICINES: What have you taken so far for the pain? (e.g., nothing, acetaminophen , NSAIDS)     NSAIDS 9. NEUROLOGIC SYMPTOMS: Do you have any weakness, numbness, or problems with bowel/bladder control?     Feels tired  10. OTHER SYMPTOMS: Do you have any other symptoms? (e.g., fever, abdomen pain, burning with urination, blood in  urine)      none  Protocols used: Back Pain-A-AH

## 2024-01-07 NOTE — Telephone Encounter (Signed)
Reason for Disposition  . [1] MODERATE back pain (e.g., interferes with normal activities) AND [2] present > 3 days    Protocols used: Back Pain-A-AH

## 2024-01-07 NOTE — ED Provider Notes (Signed)
 [facility]    CSN: [id number] Arrival date & time: 01/07/24  1715      History   Chief Complaint Chief Complaint  Patient presents with   Back Pain    HPI Danahi Reddish is a 63 y.o. female.   Complicated patient.  She is here for mid back pain towards the left side, left scapular region.  It has been coming on and on for months, but had an acute worsening on 12/28/2023.  She went to the emergency room.  She was diagnosed as having chest and back pain, cardiac ruled out.  She was sent to cardiology.  She was evaluated by a [facility] cardiologist.  It was identified that she had a [facility].  So she came to this doctor as well.  She has no medication for pain.  She continues to have pain in her back.  She is continuing to work.  She is continuing to exercise.  The pain is a constant feeling.  At the time that it first occurred it was a severe sudden pain in her left mid back.  A 10 on a scale of 1-10.  She was treated with Toradol and oxycodone in the emergency room to bring it down to a level 5.  It has been postulated that it is musculoskeletal.  No GI complaints.  No heartburn or abdominal pain, no nausea or vomiting.  No fever or chills.  No respiratory complaint, shortness of breath or cough.  She does have some pain with deep breath.  Patient is anxious about this pain.  Wants to know what is causing it.  Cannot see her primary care doctor until Monday.    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 Repeat in 5 years.     Hyperlipidemia    Microscopic hematuria 10/13/2014   Mild mitral regurgitation 10/02/2023   Nondisplaced fracture of lateral malleolus of  right fibula, initial encounter for closed fracture 01/22/2018   Pure hypercholesterolemia 10/15/2020   Sinus bradycardia 08/30/2023   Solitary pulmonary nodule 10/15/2020   Tiredness 08/30/2023   Uric acid crystalluria 10/16/2014    Patient Active Problem List   Diagnosis Date Noted   Atypical chest pain 01/04/2024   Class 1 obesity 10/02/2023   Mild mitral regurgitation 10/02/2023   Sinus bradycardia 08/30/2023   Tiredness 08/30/2023   Abnormal EKG    Essential hypertension    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      Home Medications    Prior to Admission medications   Medication Sig Start Date End Date Taking?  Authorizing Provider  cyclobenzaprine  (FLEXERIL ) 10 MG tablet Take 1 tablet (10 mg total) by mouth at bedtime. 01/07/24  Yes Maranda Jamee Jacob, MD  naproxen  sodium (ANAPROX  DS) 550 MG tablet Take 1 tablet (550 mg total) by mouth 2 (two) times daily with a meal. 01/07/24  Yes Maranda Jamee Jacob, MD  atorvastatin  (LIPITOR) 10 MG tablet Take 1 tablet (10 mg total) by mouth daily. 11/05/15   Juliane Che, PA  lisinopril -hydrochlorothiazide  (ZESTORETIC ) 20-25 MG tablet Take 2 tablets by mouth daily. 12/18/23   Madireddy, Alean SAUNDERS, MD  metoprolol  tartrate (LOPRESSOR ) 100 MG tablet Take 1 tablet (100 mg total) by mouth once for 1 dose. Take 2 hours prior to CT  01/04/24 01/04/24  Madireddy, Alean SAUNDERS, MD  spironolactone  (ALDACTONE ) 25 MG tablet Take 1 tablet (25 mg total) by mouth daily. 12/18/23   Madireddy, Alean SAUNDERS, MD    Family History Family History  Problem Relation Age of Onset   Diabetes Mother    Diabetes Sister    Hypertension Sister     Social History Social History   Tobacco Use   Smoking status: Never   Smokeless tobacco: Never  Vaping Use   Vaping status: Never Used  Substance Use Topics   Alcohol use: No    Alcohol/week: 0.0 standard drinks of alcohol   Drug use: No     Allergies   Patient has no known allergies.   Review of Systems Review of Systems See HPI  Physical Exam Triage Vital Signs ED Triage Vitals  Encounter Vitals Group     BP 01/07/24 1726 (!) 153/76     Girls Systolic BP Percentile --      Girls Diastolic BP Percentile --      Boys Systolic BP Percentile --      Boys Diastolic BP Percentile --      Pulse Rate 01/07/24 1726 66     Resp 01/07/24 1726 16     Temp 01/07/24 1726 98.9 F (37.2 C)     Temp src --      SpO2 01/07/24 1726 99 %     Weight --      Height --      Head Circumference --      Peak Flow --      Pain Score 01/07/24 1729 3     Pain Loc --      Pain Education --      Exclude from Growth Chart --    No data found.  Updated Vital Signs BP (!) 153/76   Pulse 66   Temp 98.9 F (37.2 C)   Resp 16   SpO2 99%    Physical Exam Constitutional:      General: She is in acute distress.     Appearance: She is well-developed and normal weight. She is ill-appearing.     Comments: Appears uncomfortable.  Appears concerned for health  HENT:     Head: Normocephalic and atraumatic.     Right Ear: Tympanic membrane normal.     Left Ear: Tympanic membrane normal.     Nose: Nose normal. No congestion.     Mouth/Throat:     Mouth: Mucous membranes are moist.     Pharynx: No posterior oropharyngeal erythema.  Eyes:     Conjunctiva/sclera: Conjunctivae normal.     Pupils:  Pupils are equal, round, and reactive to light.  Cardiovascular:     Rate and Rhythm: Normal rate and regular rhythm.  Heart sounds: Normal heart sounds.  Pulmonary:     Effort: Pulmonary effort is normal. No respiratory distress.     Breath sounds: Normal breath sounds.  Abdominal:     General: Abdomen is flat. There is no distension.     Palpations: Abdomen is soft.     Tenderness: There is no abdominal tenderness.  Musculoskeletal:        General: No tenderness. Normal range of motion.     Cervical back: Normal range of motion.     Comments: No tenderness to palpation of cervical spine or thoracic spine.  No tenderness to palpation of paraspinal muscles cervical and thoracic.  Full range of motion.  No rib pain.  Lymphadenopathy:     Cervical: No cervical adenopathy.  Skin:    General: Skin is warm and dry.  Neurological:     Mental Status: She is alert.      UC Treatments / Results  Labs (all labs ordered are listed, but only abnormal results are displayed) Labs Reviewed - No data to display  EKG   Radiology No results found.  Procedures Procedures (including critical care time)  Medications Ordered in UC Medications - No data to display  Initial Impression / Assessment and Plan / UC Course  I have reviewed the triage vital signs and the nursing notes.  Pertinent labs & imaging results that were available during my care of the patient were reviewed by me and considered in my medical decision making (see chart for details).     Long discussion with the patient and her son regarding this unusual pain.  Cardiac pain has been ruled out.  She did have a negative D-dimer and negative chest x-ray.  Doubt PE.  She does not have any GI symptoms.  Is not related to meals.  Doubt visceral referred pain.  Both cardiology consultants have stated they think it is probably musculoskeletal in origin.  She has not received treatment for musculoskeletal pain. Patient and son  both have a lot of questions about how this could come on suddenly and how she could have musculoskeletal pain when she has not done anything unusual.  I told him that I cannot explain all of her symptoms, but treatment for musculoskeletal pain is reasonable pending follow-up with her primary care doctor next week.  GI workup can follow if indicated.  Additional imaging if indicated Final Clinical Impressions(s) / UC Diagnoses   Final diagnoses:  Acute midline thoracic back pain     Discharge Instructions      Take the Anaprox  2 times a day with food.  This is an anti-inflammatory pain medication Take Flexeril  30 minutes to an hour before bedtime.  This is a mild muscle relaxer See your doctor on Monday     ED Prescriptions     Medication Sig Dispense Auth. Provider   naproxen  sodium (ANAPROX  DS) 550 MG tablet Take 1 tablet (550 mg total) by mouth 2 (two) times daily with a meal. 30 tablet Maranda Jamee Jacob, MD   cyclobenzaprine  (FLEXERIL ) 10 MG tablet Take 1 tablet (10 mg total) by mouth at bedtime. 20 tablet Maranda Jamee Jacob, MD      PDMP not reviewed this encounter.   Maranda Jamee Jacob, MD 01/07/24 2007

## 2024-01-07 NOTE — Discharge Instructions (Signed)
 Take the Anaprox  2 times a day with food.  This is an anti-inflammatory pain medication Take Flexeril  30 minutes to an hour before bedtime.  This is a mild muscle relaxer See your doctor on Monday

## 2024-01-28 ENCOUNTER — Telehealth (HOSPITAL_COMMUNITY): Payer: Self-pay | Admitting: *Deleted

## 2024-01-28 NOTE — Telephone Encounter (Signed)

## 2024-01-29 ENCOUNTER — Ambulatory Visit: Payer: Self-pay

## 2024-01-29 ENCOUNTER — Ambulatory Visit (HOSPITAL_BASED_OUTPATIENT_CLINIC_OR_DEPARTMENT_OTHER)
Admission: RE | Admit: 2024-01-29 | Discharge: 2024-01-29 | Disposition: A | Discharge status: Home / Self Care | Source: Ambulatory Visit

## 2024-01-29 ENCOUNTER — Ambulatory Visit (HOSPITAL_BASED_OUTPATIENT_CLINIC_OR_DEPARTMENT_OTHER)
Admission: RE | Admit: 2024-01-29 | Discharge: 2024-01-29 | Disposition: A | Discharge status: Home / Self Care | Source: Ambulatory Visit | Attending: Cardiology | Admitting: Cardiology

## 2024-01-29 ENCOUNTER — Other Ambulatory Visit: Payer: Self-pay | Admitting: Cardiology

## 2024-01-29 DIAGNOSIS — R931 Abnormal findings on diagnostic imaging of heart and coronary circulation: Secondary | ICD-10-CM | POA: Insufficient documentation

## 2024-01-29 DIAGNOSIS — E785 Hyperlipidemia, unspecified: Secondary | ICD-10-CM

## 2024-01-29 DIAGNOSIS — R072 Precordial pain: Secondary | ICD-10-CM | POA: Diagnosis present

## 2024-01-29 MED ORDER — IOHEXOL 350 MG/ML SOLN: 100.0000 mL | Freq: Once | INTRAVENOUS | Status: DC | PRN

## 2024-01-29 MED ORDER — NITROGLYCERIN 0.4 MG SL SUBL
SUBLINGUAL_TABLET | SUBLINGUAL | Status: AC
  Administered 2024-01-29: 0.8 mg via SUBLINGUAL
  Filled 2024-01-29: qty 14

## 2024-01-29 MED ORDER — NITROGLYCERIN 0.4 MG SL SUBL: 0.8000 mg | SUBLINGUAL_TABLET | Freq: Once | SUBLINGUAL | Status: AC

## 2024-01-29 MED ORDER — METOPROLOL TARTRATE 5 MG/5ML IV SOLN
INTRAVENOUS | Status: AC
  Filled 2024-01-29: qty 10

## 2024-01-29 MED ORDER — IOHEXOL 350 MG/ML SOLN
100.0000 mL | Freq: Once | INTRAVENOUS | Status: AC | PRN
  Administered 2024-01-29: 95 mL via INTRAVENOUS

## 2024-01-29 NOTE — Progress Notes (Signed)
 FFR Order

## 2024-01-31 MED ORDER — ATORVASTATIN CALCIUM 40 MG PO TABS: 40.0000 mg | ORAL_TABLET | Freq: Every day | ORAL | 3 refills | Status: DC

## 2024-02-06 ENCOUNTER — Ambulatory Visit: Payer: Self-pay | Admitting: Cardiology

## 2024-03-08 LAB — COMPREHENSIVE METABOLIC PANEL WITH GFR
ALT: 31 IU/L (ref 0–32)
AST: 38 IU/L (ref 0–40)
Albumin: 4.5 g/dL (ref 3.9–4.9)
Alkaline Phosphatase: 104 IU/L (ref 49–135)
BUN/Creatinine Ratio: 19 (ref 12–28)
BUN: 17 mg/dL (ref 8–27)
Bilirubin Total: 1.4 mg/dL — ABNORMAL HIGH (ref 0.0–1.2)
CO2: 21 mmol/L (ref 20–29)
Calcium: 10.3 mg/dL (ref 8.7–10.3)
Chloride: 92 mmol/L — ABNORMAL LOW (ref 96–106)
Creatinine, Ser: 0.89 mg/dL (ref 0.57–1.00)
Globulin, Total: 2.9 g/dL (ref 1.5–4.5)
Glucose: 88 mg/dL (ref 70–99)
Potassium: 4.1 mmol/L (ref 3.5–5.2)
Sodium: 132 mmol/L — ABNORMAL LOW (ref 134–144)
Total Protein: 7.4 g/dL (ref 6.0–8.5)
eGFR: 72 mL/min/1.73 (ref >59)

## 2024-03-08 LAB — LIPID PANEL
Chol/HDL Ratio: 2.8 ratio (ref 0.0–4.4)
Cholesterol, Total: 139 mg/dL (ref 100–199)
HDL: 49 mg/dL (ref >39)
LDL Chol Calc (NIH): 71 mg/dL (ref 0–99)
Triglycerides: 104 mg/dL (ref 0–149)
VLDL Cholesterol Cal: 19 mg/dL (ref 5–40)

## 2024-03-12 DIAGNOSIS — I2510 Atherosclerotic heart disease of native coronary artery without angina pectoris: Secondary | ICD-10-CM

## 2024-03-12 HISTORY — DX: Atherosclerotic heart disease of native coronary artery without angina pectoris: I25.10

## 2024-05-02 ENCOUNTER — Ambulatory Visit

## 2024-05-12 DIAGNOSIS — Z1211 Encounter for screening for malignant neoplasm of colon: Secondary | ICD-10-CM | POA: Insufficient documentation

## 2024-05-13 ENCOUNTER — Ambulatory Visit: Admitting: Family Medicine

## 2024-05-14 ENCOUNTER — Ambulatory Visit

## 2024-05-14 VITALS — BP 152/82 | HR 70 | Ht 65.0 in | Wt 162.4 lb

## 2024-05-14 DIAGNOSIS — I10 Essential (primary) hypertension: Secondary | ICD-10-CM

## 2024-05-14 DIAGNOSIS — I340 Nonrheumatic mitral (valve) insufficiency: Secondary | ICD-10-CM

## 2024-05-14 DIAGNOSIS — E782 Mixed hyperlipidemia: Secondary | ICD-10-CM

## 2024-05-14 DIAGNOSIS — I2510 Atherosclerotic heart disease of native coronary artery without angina pectoris: Secondary | ICD-10-CM | POA: Diagnosis not present

## 2024-05-14 MED ORDER — ATORVASTATIN CALCIUM 40 MG PO TABS: 40.0000 mg | ORAL_TABLET | Freq: Every day | ORAL | 3 refills | Status: AC

## 2024-05-14 NOTE — Assessment & Plan Note (Signed)
 Suboptimal blood pressure today. Has not taken her medications this morning. Home log of blood pressure readings show good control. Continue lisinopril -hydrochlorothiazide  40 mg - 50 mg once daily. She has spironolactone  25 mg to use, recommended to her to monitor blood pressures closely and if readings are consistently below 130/80 mmHg she can hold off on taking spironolactone .

## 2024-05-14 NOTE — Assessment & Plan Note (Addendum)
 Cardiac CT 01/29/2024: Moderate nonobstructive coronary artery disease with calcium  score 72, total plaque volume 119 mm cube moderate proximal RCA stenosis [50 to 69%] nonsignificant by CT FFR.  Overall doing well. Reviewed findings of mild atherosclerosis. Reviewed dietary and lifestyle modification and pharmacotherapy for preventing progression of the atherosclerosis. Continue atorvastatin  40 mg once daily. Recent lipid panel shows excellent control of LDL.  Role for aspirin reviewed however at this time wishes to hold off, which is reasonable.

## 2024-05-14 NOTE — Progress Notes (Signed)
 Cardiology Consultation:    Date:  05/14/2024   ID:  Carolyn Morgan, DOB Apr 07, 1961, MRN [id number]  PCP:  Carolyn Charm, MD  Cardiologist:  Carolyn JONELLE Kobus, MD   Referring MD: Carolyn Morgan,*   No chief complaint on file.    ASSESSMENT AND PLAN:   [name] 63 year old woman with CAD moderate nonobstructive disease on cardiac CT 01/29/2024 done for evaluation of chest pain, also has history of hypertension, hyperlipidemia, hepatic steatosis, echocardiogram April 2025 with normal biventricular function with mild LVH, mild LV, asymptomatic sinus bradycardia that was attributed to atenolol  and resolved subsequently after discontinuing the medication.  Here for routine follow-up.  Problem List Items Addressed This Visit     Hyperlipidemia - Primary   lipid panel from PCPs office 04/15/2024 reviewed on [facility] shows total cholesterol 126, HDL 46, LDL 62 and triglycerides 95.   Good control optimal LDL and HDL levels. Continue atorvastatin  40 mg once daily.  Tolerating well.       Relevant Medications   atorvastatin  (LIPITOR) 40 MG tablet   Other Relevant Orders   Comprehensive metabolic panel with GFR   Lipid panel   CBC   Essential hypertension   Suboptimal blood pressure today. Has not taken her medications this morning. Home log of blood pressure readings show good control. Continue lisinopril -hydrochlorothiazide  40 mg - 50 mg once daily. She has spironolactone  25 mg to use, recommended to her to monitor blood pressures closely and if readings are consistently below 130/80 mmHg she can hold off on taking spironolactone .      Relevant Medications   atorvastatin  (LIPITOR) 40 MG tablet   Mild mitral regurgitation   Will follow-up with repeat echocardiogram tentatively around April 2027.      Relevant Medications   atorvastatin  (LIPITOR) 40 MG tablet   Coronary atherosclerosis, cardiac CT 01/29/2024 moderate nonobstructive disease   Cardiac CT  01/29/2024: Moderate nonobstructive coronary artery disease with calcium  score 72, total plaque volume 119 mm cube moderate proximal RCA stenosis [50 to 69%] nonsignificant by CT FFR.  Overall doing well. Reviewed findings of mild atherosclerosis. Reviewed dietary and lifestyle modification and pharmacotherapy for preventing progression of the atherosclerosis. Continue atorvastatin  40 mg once daily. Recent lipid panel shows excellent control of LDL.  Role for aspirin reviewed however at this time wishes to hold off, which is reasonable.      Relevant Medications   atorvastatin  (LIPITOR) 40 MG tablet   Return to clinic tentatively in 6 months.  Will obtain blood work CMP, CBC and lipid panel prior to next visit.   History of Present Illness:    Carolyn Morgan is a 63 y.o. female who is being seen today for follow-up visit. PCP is Carolyn Morgan,*. Last visit with me in the office was 01/04/2024.   history of hypertension, hyperlipidemia, hepatic steatosis, LVH, mild MR on echocardiogram April 2025, asymptomatic sinus bradycardia attributed to ongoing antihypertensive treatment with atenolol  and was subsequently discontinued.  She was reporting symptoms of chest pain going to the back and further evaluation with cardiac CT was done 01/29/2024 CAD RADS 3 study calcium  score 72 with moderate stenosis of proximal RCA not hemodynamically significant by CT FFR.  Pleasant woman here for the visit today by herself.  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 Mentions she has been continuing to exercise.  She has been reassured with her cardiac CT findings and has been feeling well.  Denies any significant recurrent symptoms of chest pain or shortness of  breath. No other active cardiac symptoms. Continues to take her medication consistently however she holds off on taking blood pressure medications on days her blood pressure systolic is 110 or below.  Home log of blood pressure  readings reviewed systolic blood pressure ranging from 107 to 140 mmHg.  Compliant with her medications including atorvastatin , lisinopril -hydrochlorothiazide , spironolactone  except for holding off on doses of blood pressure medications occasionally.  Good diet adherence.  Recent lipid panel from PCPs office 04/15/2024 reviewed on [facility] shows total cholesterol 126, HDL 46, LDL 62 and triglycerides 95.   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 Repeat in 5 years.     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     Allergies:   Patient has no known allergies.   Social History   Socioeconomic History   Marital status: Married    Spouse name: not legally separated   Number of children: 2   Years of education: Not on  file   Highest education level: Not on file  Occupational History   Occupation: research associate    Comment: [employer]  Tobacco Use   Smoking status: Never   Smokeless tobacco: Never  Vaping Use   Vaping status: Never Used  Substance and Sexual Activity   Alcohol use: No    Alcohol/week: 0.0 standard drinks of alcohol   Drug use: No   Sexual activity: Not on file  Other Topics Concern   Not on file  Social History Narrative   From [place].   Lives with her sons.   Her [place] lives in the [place].   Social Drivers of Corporate Investment Banker Strain: Not on file  Food Insecurity: Not on file  Transportation Needs: Not on file  Physical Activity: Not on file  Stress: Not on file  Social Connections: Not on file     Family History: The patient's family history includes Diabetes in her mother and sister; Hypertension in her sister. ROS:   Please see the history of present illness.    All 14 point review of systems negative except as described per history of present illness.  EKGs/Labs/Other Studies Reviewed:    The following studies were reviewed today:   EKG:       Recent Labs: 03/07/2024: ALT 31; BUN 17; Creatinine, Ser 0.89; Potassium 4.1; Sodium 132  Recent Lipid Panel  Component Value Date/Time   CHOL 139 03/07/2024 1550   TRIG 104 03/07/2024 1550   HDL 49 03/07/2024 1550   CHOLHDL 2.8 03/07/2024 1550   CHOLHDL 3.5 11/05/2015 1519   VLDL 52 (H) 11/05/2015 1519   LDLCALC 71 03/07/2024 1550    Physical Exam:    VS:  BP (!) 152/82   Pulse 70   Ht 5' 5 (1.651 m)   Wt 162 lb 6.4 oz (73.7 kg)   SpO2 99%   BMI 27.02 kg/m     Wt Readings from Last 3 Encounters:  05/14/24 162 lb 6.4 oz (73.7 kg)  01/04/24 164 lb 9.6 oz (74.7 kg)  12/18/23 163 lb (73.9 kg)     GENERAL:  Well nourished, well developed in no acute distress NECK: No JVD; No carotid bruits CARDIAC: RRR, S1 and S2 present, no murmurs, no rubs, no gallops CHEST:  Clear to  auscultation without rales, wheezing or rhonchi  Extremities: No pitting pedal edema. Pulses bilaterally symmetric with radial 2+ and dorsalis pedis 2+ NEUROLOGIC:  Alert and oriented x 3  Medication Adjustments/Labs and Tests Ordered: Current medicines are reviewed at length with the patient today.  Concerns regarding medicines are outlined above.  Orders Placed This Encounter  Procedures   Comprehensive metabolic panel with GFR   Lipid panel   CBC   Meds ordered this encounter  Medications   atorvastatin  (LIPITOR) 40 MG tablet    Sig: Take 1 tablet (40 mg total) by mouth daily.    Dispense:  90 tablet    Refill:  3    Signed, Baruc Tugwell reddy Kenise Barraco, MD, MPH, [facility]. 05/14/2024 8:37 AM    [facility]

## 2024-05-14 NOTE — Patient Instructions (Signed)
 Medication Instructions:  Your physician recommends that you continue on your current medications as directed. Please refer to the Current Medication list given to you today.  *If you need a refill on your cardiac medications before your next appointment, please call your pharmacy*  Lab Work: Your physician recommends that you return for lab work in:   Labs 1 week before next visit: CMP, CBC, Lipids  If you have labs (blood work) drawn today and your tests are completely normal, you will receive your results only by: MyChart Message (if you have MyChart) OR A paper copy in the mail If you have any lab test that is abnormal or we need to change your treatment, we will call you to review the results.  Testing/Procedures: None  Follow-Up: At [facility], you and your health needs are our priority.  As part of our continuing mission to provide you with exceptional heart care, our providers are all part of one team.  This team includes your primary Cardiologist (physician) and Advanced Practice Providers or APPs (Physician Assistants and Nurse Practitioners) who all work together to provide you with the care you need, when you need it.  Your next appointment:   6 month(s)  Provider:   Alean Kobus, MD    We recommend signing up for the patient portal called MyChart.  Sign up information is provided on this After Visit Summary.  MyChart is used to connect with patients for Virtual Visits (Telemedicine).  Patients are able to view lab/test results, encounter notes, upcoming appointments, etc.  Non-urgent messages can be sent to your provider as well.   To learn more about what you can do with MyChart, go to [email or web address].   Other Instructions None

## 2024-05-14 NOTE — Assessment & Plan Note (Signed)
 lipid panel from PCPs office 04/15/2024 reviewed on [facility] shows total cholesterol 126, HDL 46, LDL 62 and triglycerides 95.   Good control optimal LDL and HDL levels. Continue atorvastatin  40 mg once daily.  Tolerating well.

## 2024-05-14 NOTE — Assessment & Plan Note (Signed)
 Will follow-up with repeat echocardiogram tentatively around April 2027.

## 2024-12-02 ENCOUNTER — Ambulatory Visit: Admitting: Family Medicine
